# Patient Record
Sex: Female | Born: 1970 | Race: White | Hispanic: No | Marital: Married | Smoking: Never smoker
Health system: Southern US, Community
[De-identification: ages and names within clinical notes are randomized; demographics above are authoritative.]

## PROBLEM LIST (undated history)

## (undated) DIAGNOSIS — K649 Unspecified hemorrhoids: Secondary | ICD-10-CM

## (undated) DIAGNOSIS — M722 Plantar fascial fibromatosis: Secondary | ICD-10-CM

## (undated) DIAGNOSIS — N8003 Adenomyosis of the uterus: Secondary | ICD-10-CM

## (undated) DIAGNOSIS — I10 Essential (primary) hypertension: Secondary | ICD-10-CM

## (undated) DIAGNOSIS — N946 Dysmenorrhea, unspecified: Secondary | ICD-10-CM

## (undated) DIAGNOSIS — N921 Excessive and frequent menstruation with irregular cycle: Secondary | ICD-10-CM

## (undated) DIAGNOSIS — H269 Unspecified cataract: Secondary | ICD-10-CM

## (undated) HISTORY — PX: TONSILLECTOMY: SUR1361

## (undated) HISTORY — DX: Essential (primary) hypertension: I10

## (undated) HISTORY — DX: Unspecified cataract: H26.9

## (undated) HISTORY — PX: WISDOM TOOTH EXTRACTION: SHX21

## (undated) HISTORY — PX: BREAST EXCISIONAL BIOPSY: SUR124

## (undated) HISTORY — PX: BREAST SURGERY: SHX581

## (undated) HISTORY — DX: Plantar fascial fibromatosis: M72.2

---
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
## 1998-12-04 HISTORY — PX: BREAST EXCISIONAL BIOPSY: SUR124

## 2012-01-01 ENCOUNTER — Other Ambulatory Visit (HOSPITAL_COMMUNITY)
Admission: RE | Admit: 2012-01-01 | Discharge: 2012-01-01 | Disposition: A | Discharge status: Home / Self Care | Payer: BC Managed Care – PPO | Source: Ambulatory Visit | Attending: Gynecology | Admitting: Gynecology

## 2012-01-01 ENCOUNTER — Ambulatory Visit (INDEPENDENT_AMBULATORY_CARE_PROVIDER_SITE_OTHER): Payer: BC Managed Care – PPO | Admitting: Gynecology

## 2012-01-01 ENCOUNTER — Encounter: Payer: Self-pay | Admitting: Gynecology

## 2012-01-01 VITALS — BP 122/76 | Ht 64.0 in | Wt 150.0 lb

## 2012-01-01 DIAGNOSIS — Z01419 Encounter for gynecological examination (general) (routine) without abnormal findings: Secondary | ICD-10-CM

## 2012-01-01 DIAGNOSIS — N63 Unspecified lump in unspecified breast: Secondary | ICD-10-CM

## 2012-01-01 DIAGNOSIS — I10 Essential (primary) hypertension: Secondary | ICD-10-CM | POA: Insufficient documentation

## 2012-01-01 DIAGNOSIS — D249 Benign neoplasm of unspecified breast: Secondary | ICD-10-CM | POA: Insufficient documentation

## 2012-01-01 LAB — URINALYSIS W MICROSCOPIC + REFLEX CULTURE
Bilirubin Urine: NEGATIVE
Glucose, UA: NEGATIVE mg/dL
Leukocytes, UA: NEGATIVE
Protein, ur: NEGATIVE mg/dL
Specific Gravity, Urine: >1.03 — ABNORMAL HIGH (ref 1.005–1.030)
pH: 5.5 (ref 5.0–8.0)

## 2012-01-01 NOTE — Progress Notes (Signed)
Magdalen Cabana 1970-12-10 [id number]        41 y.o.  New patient for annual exam.  Overall doing well. She does note some mild urgency type symptoms. It was thought to be due to her hydrochlorothiazide and she was switched to lisinopril but it seems to have persisted. No other urinary symptoms such as frequency or discomfort.  Past medical history,surgical history, medications, allergies, family history and social history were all reviewed and documented in the EPIC chart. ROS:  Was performed and pertinent positives and negatives are included in the history.  Exam: Kim chaperone present Filed Vitals:   01/01/12 1026  BP: 122/76   General appearance  Normal Skin grossly normal Head/Neck normal with no cervical or supraclavicular adenopathy thyroid normal Lungs  clear Cardiac RR, without RMG Abdominal  soft, nontender, without masses, organomegaly or hernia Breasts  examined lying and sitting. Left without masses, retractions, discharge or axillary adenopathy.  Right with 1 cm soft subcutaneous mass 3:00 periphery junction with the sternum. No overlying skin changes nipple discharge or axillary adenopathy Pelvic  Ext/BUS/vagina  normal   Cervix  normal Pap done  Uterus  anteverted, normal size, shape and contour, midline and mobile nontender   Adnexa  Without masses or tenderness    Anus and perineum  normal   Rectovaginal  normal sphincter tone without palpated masses or tenderness.    Assessment/Plan:  41 y.o. female for annual exam.    1. Right breast mass. Not felt by patient initially but now appreciated. 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 Will start with mammogram and ultrasound. Differential reviewed to include breast tissue, lipoma, cyst, fibroadenoma, malignancy. Patient understands the need for follow up.  SBE monthly reviewed. 2. Using vasectomy birth control. 3. Pap smear done as new to the practice. No history of  abnormal Paps before. Discussed possible less frequent screening in the future. 4. Urgency type symptoms.  Will check urinalysis. Discussed possibilities to include interstitial cystitis. She will further discuss with her internist if it persists about whether this is a medication side effect. Ultimate referral to urology discussed. 5. Health maintenance. No blood work was done today as it's all done through her primary who follows her for her hypertension.    Dara Lords MD, 11:10 AM 01/01/2012

## 2012-01-01 NOTE — Patient Instructions (Addendum)
Office will call you to arrange mammography and ultrasound of right breast mass.  Otherwise follow up in one year for your annual gynecologic exam.

## 2012-01-02 ENCOUNTER — Telehealth: Payer: Self-pay | Admitting: *Deleted

## 2012-01-02 DIAGNOSIS — N6310 Unspecified lump in the right breast, unspecified quadrant: Secondary | ICD-10-CM

## 2012-01-02 NOTE — Telephone Encounter (Signed)
Pt called to check on appointment with breast center for right breast mass. Order placed in computer. Pt will be contacted by breast center per jennifer at the breast center.

## 2012-01-02 NOTE — Telephone Encounter (Signed)
appt set up with [facility] on 01/08/12 @3 :20pm patient aware.

## 2012-01-02 NOTE — Telephone Encounter (Signed)
Message copied by Valeda Malm L on Tue Jan 02, 2012  2:20 PM ------      Message from: Colin Broach P      Created: Mon Jan 01, 2012 11:15 AM       Schedule diagnostic mammogram and right breast ultrasound reference right breast mass 3:00 periphery at the junction with the sternum.

## 2012-01-08 ENCOUNTER — Ambulatory Visit
Admission: RE | Admit: 2012-01-08 | Discharge: 2012-01-08 | Disposition: A | Discharge status: Home / Self Care | Payer: BC Managed Care – PPO | Source: Ambulatory Visit | Attending: Gynecology | Admitting: Gynecology

## 2012-01-08 DIAGNOSIS — N6310 Unspecified lump in the right breast, unspecified quadrant: Secondary | ICD-10-CM

## 2012-02-27 ENCOUNTER — Other Ambulatory Visit: Payer: Self-pay | Admitting: Family Medicine

## 2012-02-27 DIAGNOSIS — R1011 Right upper quadrant pain: Secondary | ICD-10-CM

## 2012-02-28 ENCOUNTER — Other Ambulatory Visit: Payer: Self-pay | Admitting: Family Medicine

## 2012-02-28 ENCOUNTER — Ambulatory Visit
Admission: RE | Admit: 2012-02-28 | Discharge: 2012-02-28 | Disposition: A | Discharge status: Home / Self Care | Payer: BC Managed Care – PPO | Source: Ambulatory Visit | Attending: Family Medicine | Admitting: Family Medicine

## 2012-02-28 DIAGNOSIS — R1011 Right upper quadrant pain: Secondary | ICD-10-CM

## 2013-01-02 ENCOUNTER — Ambulatory Visit (INDEPENDENT_AMBULATORY_CARE_PROVIDER_SITE_OTHER): Payer: BC Managed Care – PPO | Admitting: Gynecology

## 2013-01-02 ENCOUNTER — Encounter: Payer: Self-pay | Admitting: Gynecology

## 2013-01-02 VITALS — BP 122/72 | Ht 65.0 in | Wt 150.0 lb

## 2013-01-02 DIAGNOSIS — N949 Unspecified condition associated with female genital organs and menstrual cycle: Secondary | ICD-10-CM

## 2013-01-02 DIAGNOSIS — Z13220 Encounter for screening for lipoid disorders: Secondary | ICD-10-CM

## 2013-01-02 DIAGNOSIS — N644 Mastodynia: Secondary | ICD-10-CM

## 2013-01-02 DIAGNOSIS — Z01419 Encounter for gynecological examination (general) (routine) without abnormal findings: Secondary | ICD-10-CM

## 2013-01-02 DIAGNOSIS — R102 Pelvic and perineal pain: Secondary | ICD-10-CM

## 2013-01-02 LAB — CBC WITH DIFFERENTIAL/PLATELET
Basophils Absolute: 0.1 10*3/uL (ref 0.0–0.1)
Eosinophils Relative: 2 % (ref 0–5)
HCT: 39.4 % (ref 36.0–46.0)
Hemoglobin: 14 g/dL (ref 12.0–15.0)
Lymphocytes Relative: 27 % (ref 12–46)
Lymphs Abs: 1.8 10*3/uL (ref 0.7–4.0)
MCV: 90 fL (ref 78.0–100.0)
Monocytes Absolute: 0.5 10*3/uL (ref 0.1–1.0)
Neutro Abs: 4.3 10*3/uL (ref 1.7–7.7)
RBC: 4.38 MIL/uL (ref 3.87–5.11)
WBC: 6.8 10*3/uL (ref 4.0–10.5)

## 2013-01-02 NOTE — Patient Instructions (Addendum)
Follow up for ultrasound as scheduled 

## 2013-01-02 NOTE — Progress Notes (Signed)
Jodi Short 1971/02/16 [id number]        42 y.o.  [id number] for annual exam.  Several issues noted below.  Past medical history,surgical history, medications, allergies, family history and social history were all reviewed and documented in the EPIC chart. ROS:  Was performed and pertinent positives and negatives are included in the history.  Exam: Kim assistant Filed Vitals:   01/02/13 1358  BP: 122/72  Height: 5\' 5"  (1.651 m)  Weight: 150 lb (68.04 kg)   General appearance  Normal Skin grossly normal Head/Neck normal with no cervical or supraclavicular adenopathy thyroid normal Lungs  clear Cardiac RR, without RMG Abdominal  soft, nontender, without masses, organomegaly or hernia Breasts  examined lying and sitting without masses, retractions, discharge or axillary adenopathy. Pelvic  Ext/BUS/vagina  normal   Cervix  normal   Uterus  anteverted, normal size, shape and contour, midline and mobile nontender   Adnexa  Without masses or tenderness    Anus and perineum  normal   Rectovaginal  normal sphincter tone without palpated masses or tenderness.    Assessment/Plan:  42 y.o. [id number] female for annual exam, vasectomy birth control.   1. Increased dysmenorrhea x2 cycles. Patient notes 3 menstrual cycles ago she had a heavier flow with heavier cramping same thing happened to menses ago. Her last menses was lighter but still cramping. Also notes a lot more breast tenderness bilaterally this last cycle. Exam is normal. Recommend start with ultrasound and we'll go from there she'll follow up for this. 2. Mammography. Patient had diagnostic mammogram ultrasound last year due to breast lump felt in her right breast. Visionthis area has resolved and the ultrasound mammogram were negative then. Her exam today is normal with no residual palpable abnormalities. We'll continue with SBE monthly. Recommend repeat mammogram in one year as she is turning 42 and then start with annual  mammography. 3. Pap smear 2012. No Pap smear done today. No history of abnormal Pap smears. Review current screening guidelines and plan repeat at 3 year interval. 4. Health maintenance. Patient has not had blood work done in some time she is being followed for hypertension. Will check baseline CBC comprehensive metabolic panel lipid profile and urinalysis. Follow up ultrasound.    Dara Lords MD, 2:33 PM 01/02/2013

## 2013-01-03 ENCOUNTER — Other Ambulatory Visit: Payer: Self-pay | Admitting: Gynecology

## 2013-01-03 DIAGNOSIS — E7800 Pure hypercholesterolemia, unspecified: Secondary | ICD-10-CM

## 2013-01-03 LAB — COMPREHENSIVE METABOLIC PANEL
ALT: 24 U/L (ref 0–35)
AST: 24 U/L (ref 0–37)
Albumin: 4.6 g/dL (ref 3.5–5.2)
BUN: 9 mg/dL (ref 6–23)
CO2: 28 mEq/L (ref 19–32)
Calcium: 9.3 mg/dL (ref 8.4–10.5)
Chloride: 102 mEq/L (ref 96–112)
Creat: 0.87 mg/dL (ref 0.50–1.10)
Potassium: 4.4 mEq/L (ref 3.5–5.3)

## 2013-01-03 LAB — URINALYSIS W MICROSCOPIC + REFLEX CULTURE
Bilirubin Urine: NEGATIVE
Casts: NONE SEEN
Glucose, UA: NEGATIVE mg/dL
Squamous Epithelial / LPF: NONE SEEN
pH: 7.5 (ref 5.0–8.0)

## 2013-01-03 LAB — LIPID PANEL
Cholesterol: 210 mg/dL — ABNORMAL HIGH (ref 0–200)
HDL: 57 mg/dL (ref >39)
Total CHOL/HDL Ratio: 3.7 Ratio

## 2013-01-06 ENCOUNTER — Other Ambulatory Visit: Payer: Self-pay | Admitting: Gynecology

## 2013-01-06 MED ORDER — CIPROFLOXACIN HCL 250 MG PO TABS: 250.0000 mg | ORAL_TABLET | Freq: Two times a day (BID) | ORAL | Status: DC

## 2013-01-13 ENCOUNTER — Ambulatory Visit: Payer: BC Managed Care – PPO | Admitting: Gynecology

## 2013-01-13 ENCOUNTER — Other Ambulatory Visit: Payer: BC Managed Care – PPO

## 2013-01-15 ENCOUNTER — Other Ambulatory Visit: Payer: BC Managed Care – PPO

## 2013-01-15 ENCOUNTER — Ambulatory Visit: Payer: BC Managed Care – PPO | Admitting: Gynecology

## 2013-01-18 ENCOUNTER — Other Ambulatory Visit: Payer: Self-pay

## 2013-01-29 ENCOUNTER — Encounter: Payer: Self-pay | Admitting: Gynecology

## 2013-01-29 ENCOUNTER — Ambulatory Visit (INDEPENDENT_AMBULATORY_CARE_PROVIDER_SITE_OTHER): Payer: BC Managed Care – PPO

## 2013-01-29 ENCOUNTER — Ambulatory Visit (INDEPENDENT_AMBULATORY_CARE_PROVIDER_SITE_OTHER): Payer: BC Managed Care – PPO | Admitting: Gynecology

## 2013-01-29 DIAGNOSIS — N926 Irregular menstruation, unspecified: Secondary | ICD-10-CM

## 2013-01-29 DIAGNOSIS — N949 Unspecified condition associated with female genital organs and menstrual cycle: Secondary | ICD-10-CM

## 2013-01-29 DIAGNOSIS — D251 Intramural leiomyoma of uterus: Secondary | ICD-10-CM

## 2013-01-29 DIAGNOSIS — N83209 Unspecified ovarian cyst, unspecified side: Secondary | ICD-10-CM

## 2013-01-29 DIAGNOSIS — N83299 Other ovarian cyst, unspecified side: Secondary | ICD-10-CM

## 2013-01-29 DIAGNOSIS — R102 Pelvic and perineal pain unspecified side: Secondary | ICD-10-CM

## 2013-01-29 DIAGNOSIS — N8300 Follicular cyst of ovary, unspecified side: Secondary | ICD-10-CM

## 2013-01-29 DIAGNOSIS — D259 Leiomyoma of uterus, unspecified: Secondary | ICD-10-CM

## 2013-01-29 DIAGNOSIS — N946 Dysmenorrhea, unspecified: Secondary | ICD-10-CM

## 2013-01-29 NOTE — Patient Instructions (Addendum)
Keep menstrual and pain calendar. Follow up for repeat ultrasound in 3 months. Follow up sooner if significant irregular menses or pain. If you do not start your period this coming week then call and we'll plan on a progesterone withdrawal to bring on a period after checking a pregnancy test.

## 2013-01-29 NOTE — Progress Notes (Signed)
Patient presents for ultrasound.  Had to menses are heavy and very crampy and that her last LMP 12/25/2012 was later but still with some cramps.  Ultrasound shows uterus generous in length with small 18 mm myoma. Endometrial echo 14 mm. Right ovary with thin-walled echo-free cyst 25 mm mean and a smaller paraovarian cyst at 13 and 12. Left ovary shows thin-walled tubular cystic area 19 x 14 avascular. Cul-de-sac negative.  Assessment and plan: Heavy menses x2 with dysmenorrhea. Her last menses was lighter. She's late for her current menses. Vasectomy birth control. Ultrasound shows probable physiologic changes otherwise unremarkable. No evidence of endometriomas. Recommend to keep menstrual calendar over the next several months a repeat ultrasound to follow up on the cystic changes in 3 months. If without menses going into next week we'll check hCG and plan progesterone withdrawal. Ultimately if pain persists then we'll consider laparoscopy.

## 2013-02-06 ENCOUNTER — Telehealth: Payer: Self-pay | Admitting: *Deleted

## 2013-02-06 MED ORDER — MEDROXYPROGESTERONE ACETATE 10 MG PO TABS: 10.0000 mg | ORAL_TABLET | Freq: Every day | ORAL | Status: DC

## 2013-02-06 NOTE — Telephone Encounter (Signed)
Pt is calling to follow up OV on 01/29/13, pt cycle still has not started yet, she was told to follow up. Please advise

## 2013-02-06 NOTE — Telephone Encounter (Signed)
Left message for pt to call.

## 2013-02-06 NOTE — Telephone Encounter (Signed)
Check over-the-counter UPT just to make sure and then Provera 10 mg x10 days should bring on a period. Orders placed

## 2013-02-06 NOTE — Telephone Encounter (Signed)
Pt informed with the below note. 

## 2013-02-12 ENCOUNTER — Telehealth: Payer: Self-pay | Admitting: *Deleted

## 2013-02-12 NOTE — Telephone Encounter (Signed)
Pt was given Rx for provera 10 mg x 10 days on 02/06/13. Took provera on Sunday night and noticed dry cough yesterday morning. She thought since this was a new issue that was noticed after taking provera she would ask if this could be rlated to medication? Please advise

## 2013-02-12 NOTE — Telephone Encounter (Signed)
Pt informed with the below note. 

## 2013-02-12 NOTE — Telephone Encounter (Signed)
I doubt it..  

## 2013-04-03 ENCOUNTER — Ambulatory Visit
Admission: RE | Admit: 2013-04-03 | Discharge: 2013-04-03 | Disposition: A | Discharge status: Home / Self Care | Payer: BC Managed Care – PPO | Source: Ambulatory Visit | Attending: Family Medicine | Admitting: Family Medicine

## 2013-04-03 ENCOUNTER — Other Ambulatory Visit: Payer: Self-pay | Admitting: Family Medicine

## 2013-04-03 DIAGNOSIS — S161XXA Strain of muscle, fascia and tendon at neck level, initial encounter: Secondary | ICD-10-CM

## 2013-04-07 ENCOUNTER — Ambulatory Visit: Payer: BC Managed Care – PPO | Admitting: Physical Therapy

## 2013-04-08 ENCOUNTER — Ambulatory Visit: Payer: BC Managed Care – PPO

## 2013-04-30 ENCOUNTER — Ambulatory Visit (INDEPENDENT_AMBULATORY_CARE_PROVIDER_SITE_OTHER): Payer: BC Managed Care – PPO | Admitting: Gynecology

## 2013-04-30 ENCOUNTER — Encounter: Payer: Self-pay | Admitting: Gynecology

## 2013-04-30 ENCOUNTER — Ambulatory Visit (INDEPENDENT_AMBULATORY_CARE_PROVIDER_SITE_OTHER): Payer: BC Managed Care – PPO

## 2013-04-30 DIAGNOSIS — N949 Unspecified condition associated with female genital organs and menstrual cycle: Secondary | ICD-10-CM

## 2013-04-30 DIAGNOSIS — R102 Pelvic and perineal pain: Secondary | ICD-10-CM

## 2013-04-30 DIAGNOSIS — N7011 Chronic salpingitis: Secondary | ICD-10-CM

## 2013-04-30 DIAGNOSIS — N7013 Chronic salpingitis and oophoritis: Secondary | ICD-10-CM

## 2013-04-30 DIAGNOSIS — N83209 Unspecified ovarian cyst, unspecified side: Secondary | ICD-10-CM

## 2013-04-30 NOTE — Progress Notes (Signed)
Patient follows up for ultrasound.  Ultrasound 01/29/2013 shows uterus generous in length with small 18 mm myoma. Endometrial echo 14 mm. Right ovary with thin-walled echo-free cyst 25 mm mean and a smaller paraovarian cyst at 13 and 12. Left ovary shows thin-walled tubular cystic area 19 x 14 avascular. Cul-de-sac negative. Was recommended for followup ultrasound to relook at the left adnexa and also followup on her symptoms. Patient notes since then she's had several menses that are lighter. The last 2 came at 21 day intervals. Having fleeting left lower quadrant lasting minutes stopping her from walking. No nausea vomiting diarrhea constipation. No urinary symptoms. No dyspareunia.  Ultrasound today shows uterus unchanged from prior exam. Endometrial echo 6.6 mm. Right ovary normal. Left ovary with continued presence of the tubular cystic avascular mass measuring 45 x 11 x 21 mm. Cul-de-sac negative.  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 Reviewed possibilities to include hydrosalpinx, possible endometriosis possible ovarian related. Disclaimer cannot rule out carcinoma discussed. Options of laparoscopic assessment and removal now versus continued observation with re\re ultrasounded for 6 months discussed. The issues of surgical risk versus missed carcinoma reviewed. Patient wants to monitor at present. She'll call if she wants to proceed with surgery before hand but tentatively plans on repeat ultrasound in 4-6 weeks and will call to schedule this.

## 2013-04-30 NOTE — Patient Instructions (Signed)
Followup in 4-6 months for repeat ultrasound. Call sooner if pain worsens or you want to proceed with surgery.

## 2013-09-18 ENCOUNTER — Other Ambulatory Visit: Payer: Self-pay

## 2013-09-18 DIAGNOSIS — Z1231 Encounter for screening mammogram for malignant neoplasm of breast: Secondary | ICD-10-CM

## 2013-10-08 ENCOUNTER — Ambulatory Visit: Payer: BC Managed Care – PPO

## 2013-10-09 ENCOUNTER — Other Ambulatory Visit: Payer: Self-pay

## 2013-10-27 ENCOUNTER — Ambulatory Visit
Admission: RE | Admit: 2013-10-27 | Discharge: 2013-10-27 | Disposition: A | Discharge status: Home / Self Care | Payer: BC Managed Care – PPO | Source: Ambulatory Visit

## 2013-10-27 DIAGNOSIS — Z1231 Encounter for screening mammogram for malignant neoplasm of breast: Secondary | ICD-10-CM

## 2013-10-29 ENCOUNTER — Other Ambulatory Visit: Payer: Self-pay | Admitting: Gynecology

## 2013-10-29 DIAGNOSIS — R928 Other abnormal and inconclusive findings on diagnostic imaging of breast: Secondary | ICD-10-CM

## 2013-11-04 ENCOUNTER — Ambulatory Visit
Admission: RE | Admit: 2013-11-04 | Discharge: 2013-11-04 | Disposition: A | Discharge status: Home / Self Care | Payer: BC Managed Care – PPO | Source: Ambulatory Visit | Attending: Gynecology | Admitting: Gynecology

## 2013-11-04 DIAGNOSIS — R928 Other abnormal and inconclusive findings on diagnostic imaging of breast: Secondary | ICD-10-CM

## 2013-11-14 ENCOUNTER — Telehealth: Payer: Self-pay | Admitting: *Deleted

## 2013-11-14 DIAGNOSIS — N7011 Chronic salpingitis: Secondary | ICD-10-CM

## 2013-11-14 NOTE — Telephone Encounter (Signed)
Pt needs order repeat ultrasound in 4-6 weeks due to small persistent left adnexal tubular cystic avascular mass. Order placed.

## 2013-12-08 ENCOUNTER — Other Ambulatory Visit: Payer: Self-pay | Admitting: Gynecology

## 2013-12-08 ENCOUNTER — Ambulatory Visit (INDEPENDENT_AMBULATORY_CARE_PROVIDER_SITE_OTHER): Payer: BC Managed Care – PPO | Admitting: Gynecology

## 2013-12-08 ENCOUNTER — Encounter: Payer: Self-pay | Admitting: Gynecology

## 2013-12-08 ENCOUNTER — Ambulatory Visit (INDEPENDENT_AMBULATORY_CARE_PROVIDER_SITE_OTHER): Payer: BC Managed Care – PPO

## 2013-12-08 DIAGNOSIS — D259 Leiomyoma of uterus, unspecified: Secondary | ICD-10-CM

## 2013-12-08 DIAGNOSIS — D251 Intramural leiomyoma of uterus: Secondary | ICD-10-CM

## 2013-12-08 DIAGNOSIS — R102 Pelvic and perineal pain: Secondary | ICD-10-CM

## 2013-12-08 DIAGNOSIS — N7013 Chronic salpingitis and oophoritis: Secondary | ICD-10-CM

## 2013-12-08 DIAGNOSIS — N7011 Chronic salpingitis: Secondary | ICD-10-CM

## 2013-12-08 DIAGNOSIS — N949 Unspecified condition associated with female genital organs and menstrual cycle: Secondary | ICD-10-CM

## 2013-12-08 DIAGNOSIS — R1032 Left lower quadrant pain: Secondary | ICD-10-CM

## 2013-12-08 NOTE — Progress Notes (Signed)
Patient presents for followup ultrasound. She has a presumed hydrosalpinx in the left adnexa that we have been following. She has been having fleeting left lower quadrant pain when ambulating lasting seconds. Does not occur frequently.  Ultrasound shows a uterus overall normal with a small myoma 20 mm. Endometrial echo 8.5 mm. Right ovary normal. Left ovary normal with tubular cystic avascular area 31 x 12 x 24 mm. Appears decreased from prior study.  Assessment and plan: Small probable hydrosalpinx left adnexa. Stable if not slightly smaller than prior study. Is having fleeting left lower quadrant pain lasting seconds while ambulating. Does not occur back frequent. She is not having dyspareunia. Menses alternate between normal painful. No intermenstrual bleeding and otherwise regular. Options again reviewed to include laparoscopic removal versus observation. Again disclaimer I cannot guarantee this is not cancer reviewed. Generally what is involved with laparoscopic salpingectomy to include multiple port sites, intraoperative risks to include organ damage such as bowel bladder ureters vessels and nerves necessitating major exploratory reparative surgeries, hemorrhage and transfusion as well as infection discussed. The recovery period afterwards also reviewed. Patient wants to think about what she wants to do at this point is planning observation. She is scheduled for her annual exam in one month and we'll further discuss at that point. If observation, the question as to when to  relook at this area with ultrasound was also discussed.

## 2013-12-08 NOTE — Patient Instructions (Signed)
Followup for annual exam in one month. Thank about whether you want to proceed with surgery or not.

## 2014-01-05 ENCOUNTER — Encounter: Payer: BC Managed Care – PPO | Admitting: Gynecology

## 2014-01-19 ENCOUNTER — Encounter: Payer: BC Managed Care – PPO | Admitting: Gynecology

## 2014-02-12 ENCOUNTER — Ambulatory Visit (INDEPENDENT_AMBULATORY_CARE_PROVIDER_SITE_OTHER): Payer: BC Managed Care – PPO | Admitting: Gynecology

## 2014-02-12 ENCOUNTER — Encounter: Payer: Self-pay | Admitting: Gynecology

## 2014-02-12 VITALS — BP 110/70 | Ht 64.0 in | Wt 147.0 lb

## 2014-02-12 DIAGNOSIS — Z01419 Encounter for gynecological examination (general) (routine) without abnormal findings: Secondary | ICD-10-CM

## 2014-02-12 DIAGNOSIS — N7013 Chronic salpingitis and oophoritis: Secondary | ICD-10-CM

## 2014-02-12 DIAGNOSIS — N7011 Chronic salpingitis: Secondary | ICD-10-CM

## 2014-02-12 NOTE — Progress Notes (Signed)
Jodi Short 1971-10-02 [id number]        43 y.o.  [id number] for annual exam.  Several issues that are below.  Past medical history,surgical history, problem list, medications, allergies, family history and social history were all reviewed and documented in the EPIC chart.  ROS:  Performed and pertinent positives and negatives are included in the history, assessment and plan .  Exam: Kim assistant Filed Vitals:   02/12/14 1531  BP: 110/70  Height: 5\' 4"  (1.626 m)  Weight: 147 lb (66.679 kg)   General appearance  Normal Skin grossly normal Head/Neck normal with no cervical or supraclavicular adenopathy thyroid normal Lungs  clear Cardiac RR, without RMG Abdominal  soft, nontender, without masses, organomegaly or hernia Breasts  examined lying and sitting without masses, retractions, discharge or axillary adenopathy. Pelvic  Ext/BUS/vagina normal  Cervix normal  Uterus anteverted, normal size, shape and contour, midline and mobile nontender   Adnexa  Without masses or tenderness    Anus and perineum  Normal   Rectovaginal  Normal sphincter tone without palpated masses or tenderness.    Assessment/Plan:  43 y.o. [id number] female for annual exam regular menses, vasectomy birth control.   1. Left hydrosalpinx. Patient has small probable left hydrosalpinx that we have been following expectantly. Her last menses was more painful and crampy. Otherwise had been doing well. Her exam is normal. Recommend keeping menstrual calendar. As long as subsequent menses normal then plan repeat ultrasound in several months to followup on the hydrosalpinx. Patient still is deciding whether she wants to have this removed or continue to follow expectantly. The pros/cons, risks/benefits of expectant management missing disease versus surgery with complications discussed. 2. Mammography 10/2013. Continue with annual mammography. SBE monthly reviewed. 3. Pap smear 2013. No Pap smear done today. No history of  abnormal Pap smears previously. Plan repeat Pap smear at 3 year interval next year. 4. Health maintenance. Patient reports routine blood work done through her primary physician's office. Check urinalysis today but no other lab ordered. Followup for ultrasound in June/July sooner if continued pain or she ultimately decides she wants to proceed with surgery.   Note: This document was prepared with digital dictation and possible smart phrase technology. Any transcriptional errors that result from this process are unintentional.   Anastasio Auerbach MD, 4:38 PM 02/12/2014

## 2014-02-12 NOTE — Patient Instructions (Signed)
Follow up for ultrasound in 5-6 months

## 2014-02-13 LAB — URINALYSIS W MICROSCOPIC + REFLEX CULTURE
BACTERIA UA: NONE SEEN
Bilirubin Urine: NEGATIVE
Casts: NONE SEEN
Crystals: NONE SEEN
Glucose, UA: NEGATIVE mg/dL
HGB URINE DIPSTICK: NEGATIVE
Ketones, ur: NEGATIVE mg/dL
LEUKOCYTES UA: NEGATIVE
NITRITE: NEGATIVE
PROTEIN: NEGATIVE mg/dL
Specific Gravity, Urine: 1.019 (ref 1.005–1.030)
Squamous Epithelial / LPF: NONE SEEN
UROBILINOGEN UA: 0.2 mg/dL (ref 0.0–1.0)
pH: 6 (ref 5.0–8.0)

## 2014-03-09 ENCOUNTER — Telehealth: Payer: Self-pay | Admitting: *Deleted

## 2014-03-09 NOTE — Telephone Encounter (Signed)
Pt called c/o hemorrhoids that have caused extreme pain this past weekend. Pt asked name of MD, I told patient [facility] [place] [facility] would be the MD to help with this. Pt has had for 17 years now and would like to have a long term solution. 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.

## 2014-05-13 ENCOUNTER — Encounter: Payer: Self-pay | Admitting: Gynecology

## 2014-05-13 ENCOUNTER — Other Ambulatory Visit: Payer: Self-pay | Admitting: Gynecology

## 2014-05-13 ENCOUNTER — Ambulatory Visit (INDEPENDENT_AMBULATORY_CARE_PROVIDER_SITE_OTHER): Payer: BC Managed Care – PPO | Admitting: Gynecology

## 2014-05-13 ENCOUNTER — Ambulatory Visit (INDEPENDENT_AMBULATORY_CARE_PROVIDER_SITE_OTHER): Payer: BC Managed Care – PPO

## 2014-05-13 DIAGNOSIS — N7011 Chronic salpingitis: Secondary | ICD-10-CM

## 2014-05-13 DIAGNOSIS — N831 Corpus luteum cyst of ovary, unspecified side: Secondary | ICD-10-CM

## 2014-05-13 DIAGNOSIS — R102 Pelvic and perineal pain: Secondary | ICD-10-CM

## 2014-05-13 DIAGNOSIS — D259 Leiomyoma of uterus, unspecified: Secondary | ICD-10-CM

## 2014-05-13 DIAGNOSIS — N912 Amenorrhea, unspecified: Secondary | ICD-10-CM

## 2014-05-13 DIAGNOSIS — N7013 Chronic salpingitis and oophoritis: Secondary | ICD-10-CM

## 2014-05-13 DIAGNOSIS — R350 Frequency of micturition: Secondary | ICD-10-CM

## 2014-05-13 DIAGNOSIS — N949 Unspecified condition associated with female genital organs and menstrual cycle: Secondary | ICD-10-CM

## 2014-05-13 LAB — URINALYSIS W MICROSCOPIC + REFLEX CULTURE
Bilirubin Urine: NEGATIVE
Glucose, UA: NEGATIVE mg/dL
Hgb urine dipstick: NEGATIVE
Ketones, ur: NEGATIVE mg/dL
LEUKOCYTES UA: NEGATIVE
NITRITE: NEGATIVE
PH: 5 (ref 5.0–8.0)
Protein, ur: NEGATIVE mg/dL
SPECIFIC GRAVITY, URINE: 1.025 (ref 1.005–1.030)
UROBILINOGEN UA: 0.2 mg/dL (ref 0.0–1.0)

## 2014-05-13 LAB — PREGNANCY, URINE: PREG TEST UR: NEGATIVE

## 2014-05-13 MED ORDER — MEDROXYPROGESTERONE ACETATE 10 MG PO TABS: 10.0000 mg | ORAL_TABLET | Freq: Two times a day (BID) | ORAL | Status: DC

## 2014-05-13 NOTE — Progress Notes (Signed)
Jodi Short [birth year] [id number]        43 y.o.  [id number] presents for ultrasound in followup of her history probable hydrosalpinx. Also notes the last 2 months urinary frequency. No dysuria or urgency. Primarily at work. Has been evaluated by her primary to include a negative urinalysis and culture. LMP 03/27/2014. Vasectomy birth control. Has had some irregularity which she will be delayed by several days but never by 2-3 weeks.  Past medical history,surgical history, problem list, medications, allergies, family history and social history were all reviewed and documented in the EPIC chart.  Directed ROS with pertinent positives and negatives documented in the history of present illness/assessment and plan.  Exam: Kim assistant General appearance  Normal Spine straight without CVA tenderness. Abdomen soft nontender without masses guarding rebound.  Ultrasound shows uterus overall normal in size with small myoma at 19 mm. Endometrial echo generous at 17 mm. Right and left ovaries with physiologic changes. Continued presence of the left tubulocystic mass, avascular measuring 28 x 11 x 22 mm.  Assessment/Plan:  43 y.o. [name] several issues:  1. Urinary frequency. Urinalysis is negative. Patient does admit to drinking a fair amount coffee at work. Have asked her to stop this and see how this affects her symptoms. If her symptoms continued I discussed possible interstitial cystitis and I would refer her to urology for further evaluation. Patient will call if her symptoms persist. 2. Late for menses. Vasectomy birth control. UPT negative today. No other symptoms such as hair changes skin changes. Ultrasounds negative with the exception of a generous endometrial echo. Recommend Provera withdrawal 10 mg twice a day x5 days. If has a withdrawal bleed will keep menstrual calendar and as long as regular menses ensue and plan expectant management. If no withdrawal or irregular menses continues then plan  further workup. 3. Persistent left tubulocystic mass consistent with a small hydrosalpinx. Ultrasound in January 2015 showed the measurements 31 x 12 x 24 mm. Appears to be smaller now. Certainly everything points towards a benign etiology. Recommend continued expectant management with followup at her next annual exam and she agrees with this. She is comfortable with following expectantly.   Note: This document was prepared with digital dictation and possible smart phrase technology. Any transcriptional errors that result from this process are unintentional.   Anastasio Auerbach MD, 4:43 PM 05/13/2014

## 2014-05-13 NOTE — Patient Instructions (Signed)
Followup routinely for annual exam. Call if your urinary symptoms continue and I will refer you to a urologist.

## 2014-07-16 ENCOUNTER — Encounter (HOSPITAL_COMMUNITY): Payer: Self-pay | Admitting: Emergency Medicine

## 2014-07-16 ENCOUNTER — Emergency Department (HOSPITAL_COMMUNITY)
Admission: EM | Admit: 2014-07-16 | Discharge: 2014-07-16 | Disposition: A | Discharge status: Home / Self Care | Payer: BC Managed Care – PPO | Attending: Emergency Medicine | Admitting: Emergency Medicine

## 2014-07-16 DIAGNOSIS — R002 Palpitations: Secondary | ICD-10-CM | POA: Diagnosis not present

## 2014-07-16 DIAGNOSIS — R000 Tachycardia, unspecified: Secondary | ICD-10-CM | POA: Insufficient documentation

## 2014-07-16 DIAGNOSIS — Z3202 Encounter for pregnancy test, result negative: Secondary | ICD-10-CM | POA: Diagnosis not present

## 2014-07-16 DIAGNOSIS — I10 Essential (primary) hypertension: Secondary | ICD-10-CM | POA: Insufficient documentation

## 2014-07-16 DIAGNOSIS — Z79899 Other long term (current) drug therapy: Secondary | ICD-10-CM | POA: Diagnosis not present

## 2014-07-16 LAB — I-STAT TROPONIN, ED: Troponin i, poc: 0 ng/mL (ref 0.00–0.08)

## 2014-07-16 LAB — POC URINE PREG, ED: Preg Test, Ur: NEGATIVE

## 2014-07-16 LAB — I-STAT CHEM 8, ED
BUN: 9 mg/dL (ref 6–23)
CALCIUM ION: 1.19 mmol/L (ref 1.12–1.23)
CREATININE: 0.9 mg/dL (ref 0.50–1.10)
Chloride: 106 mEq/L (ref 96–112)
GLUCOSE: 172 mg/dL — AB (ref 70–99)
HCT: 42 % (ref 36.0–46.0)
Hemoglobin: 14.3 g/dL (ref 12.0–15.0)
Potassium: 4.3 mEq/L (ref 3.7–5.3)
SODIUM: 142 meq/L (ref 137–147)
TCO2: 25 mmol/L (ref 0–100)

## 2014-07-16 LAB — D-DIMER, QUANTITATIVE: D-Dimer, Quant: 0.36 ug/mL-FEU (ref 0.00–0.48)

## 2014-07-16 NOTE — ED Provider Notes (Signed)
CSN: [id number]     Arrival date & time 07/16/14  1628 History   First MD Initiated Contact with Patient 07/16/14 1711     Chief Complaint  Patient presents with  . Tachycardia      Patient is a 43 y.o. female presenting with palpitations. The history is provided by the patient.  Palpitations Palpitations quality:  Unable to specify Onset quality:  Sudden Timing:  Intermittent Progression:  Waxing and waning Chronicity:  New Relieved by:  None tried Worsened by:  Nothing tried Associated symptoms: no back pain, no chest pain, no lower extremity edema, no shortness of breath and no syncope   Risk factors: no hx of atrial fibrillation and no hx of PE   Pt reports she had sudden onset of "heart racing" several hrs ago while at work.  It improved spontaneously but has since returned.  No syncope but reports feeling lightheaded No active CP but reports "heart pounding"  She has no h/o CAD/PE/DVT  She does not smoke.  No drug use reported No excessive caffeine use  Past Medical History  Diagnosis Date  . Hypertension    Past Surgical History  Procedure Laterality Date  . Tonsillectomy    . Breast surgery      Breast adenoma   Family History  Problem Relation Age of Onset  . Diabetes Father   . Diabetes Maternal Grandmother   . Heart attack Maternal Grandmother   . Diabetes Paternal Grandfather   . Diabetes Son   . Melanoma Sister    History  Substance Use Topics  . Smoking status: Never Smoker   . Smokeless tobacco: Not on file  . Alcohol Use: 1.5 oz/week    3 drink(s) per week     Comment: occasionally    OB History   Grav Para Term Preterm Abortions TAB SAB Ect Mult Living   3 2 2  1  1   2      Review of Systems  Respiratory: Negative for shortness of breath.   Cardiovascular: Positive for palpitations. Negative for chest pain and syncope.  Musculoskeletal: Negative for back pain.  Neurological: Negative for syncope.  All other systems reviewed and are  negative.     Allergies  Sulfa antibiotics  Home Medications   Prior to Admission medications   Medication Sig Start Date End Date Taking? Authorizing Provider  lisinopril (PRINIVIL,ZESTRIL) 5 MG tablet Take 5 mg by mouth at bedtime.    Yes Historical Provider, MD  Multiple Vitamin (MULTIVITAMIN) tablet Take 1 tablet by mouth daily.   Yes Historical Provider, MD  Omega-3 Fatty Acids (FISH OIL) 1000 MG CAPS Take 2,000 capsules by mouth daily.   Yes Historical Provider, MD   BP 137/71  Pulse 85  Temp(Src) 98.3 F (36.8 C) (Oral)  Resp 22  SpO2 100%  LMP 07/08/2014 Physical Exam CONSTITUTIONAL: Well developed/well nourished HEAD: Normocephalic/atraumatic EYES: EOMI/PERRL ENMT: Mucous membranes moist NECK: supple no meningeal signs SPINE:entire spine nontender CV: S1/S2 noted, no murmurs/rubs/gallops noted LUNGS: Lungs are clear to auscultation bilaterally, no apparent distress ABDOMEN: soft, nontender, no rebound or guarding GU:no cva tenderness NEURO: Pt is awake/alert, moves all extremitiesx4 EXTREMITIES: pulses normal, full ROM, no LE tenderness/edema SKIN: warm, color normal PSYCH: no abnormalities of mood noted  ED Course  Procedures  6:23 PM Pt well appearing She reported an episode while I was in room.  HR was at 109, sinus rhythm EKG does not reveal any dysrhythmia 7:23 PM Labs reassuring She had no  further symptoms She has f/u tomorrow with her PCP We discussed strict return precautions Pt is stable for d/c home I doubt ACS at this time Labs Review Labs Reviewed  I-STAT CHEM 8, ED - Abnormal; Notable for the following:    Glucose, Bld 172 (*)    All other components within normal limits  D-DIMER, QUANTITATIVE  I-STAT TROPOININ, ED  POC URINE PREG, ED      EKG Interpretation   Date/Time:  Thursday July 16 2014 16:36:36 EDT Ventricular Rate:  91 PR Interval:  147 QRS Duration: 85 QT Interval:  347 QTC Calculation: 427 R Axis:   73 Text  Interpretation:  Sinus rhythm Baseline wander Otherwise within normal  limits No old tracing to compare Confirmed by [place]  MD-I, IVA ([id number]) on  07/16/2014 4:42:37 PM      MDM   Final diagnoses:  Palpitations    Nursing notes including past medical history and social history reviewed and considered in documentation Labs/vital reviewed and considered     Sharyon Cable, MD 07/16/14 [phone]

## 2014-07-16 NOTE — ED Notes (Signed)
Pt presents with c/o heart racing and feeling lightheaded. Pt reports that she has had 4 episodes of this today where she felt lightheaded and her heart felt like it was "pounding". Pt reports no hx of anxiety or any cardiac related illness. Pt reports that she does take medication for her blood pressure. HR stable at this time, 85 BPM.

## 2014-07-16 NOTE — Progress Notes (Signed)
  CARE MANAGEMENT ED NOTE 07/16/2014  Patient:  Jodi Short,Jodi Short   Account Number:  [id number]  Date Initiated:  07/16/2014  Documentation initiated by:  Jackelyn Poling  Subjective/Objective Assessment:   43 yr old bcbs ppo [place] state [place] pt c/o racing heart rate     Subjective/Objective Assessment Detail:   pcp [place] at [place] at [place]     Action/Plan:   ED CM noted not pcp listed spoke with pt EPIC updated   Action/Plan Detail:   Anticipated DC Date:       Status Recommendation to Physician:   Result of Recommendation:    Other ED [place]  Other  PCP issues  Outpatient Services - Pt will follow up    Choice offered to / List presented to:            Status of service:  Completed, signed off  ED Comments:   ED Comments Detail:

## 2014-09-30 ENCOUNTER — Other Ambulatory Visit: Payer: Self-pay

## 2014-09-30 DIAGNOSIS — Z1231 Encounter for screening mammogram for malignant neoplasm of breast: Secondary | ICD-10-CM

## 2014-10-05 ENCOUNTER — Encounter (HOSPITAL_COMMUNITY): Payer: Self-pay | Admitting: Emergency Medicine

## 2014-10-20 ENCOUNTER — Other Ambulatory Visit: Payer: Self-pay | Admitting: Dermatology

## 2014-11-12 ENCOUNTER — Ambulatory Visit
Admission: RE | Admit: 2014-11-12 | Discharge: 2014-11-12 | Disposition: A | Discharge status: Home / Self Care | Payer: BC Managed Care – PPO | Source: Ambulatory Visit

## 2014-11-12 DIAGNOSIS — Z1231 Encounter for screening mammogram for malignant neoplasm of breast: Secondary | ICD-10-CM

## 2015-03-22 ENCOUNTER — Encounter: Payer: Self-pay | Admitting: Gynecology

## 2015-05-17 ENCOUNTER — Other Ambulatory Visit (HOSPITAL_COMMUNITY)
Admission: RE | Admit: 2015-05-17 | Discharge: 2015-05-17 | Disposition: A | Discharge status: Home / Self Care | Payer: BLUE CROSS/BLUE SHIELD | Source: Ambulatory Visit | Attending: Gynecology | Admitting: Gynecology

## 2015-05-17 ENCOUNTER — Ambulatory Visit (INDEPENDENT_AMBULATORY_CARE_PROVIDER_SITE_OTHER): Payer: BLUE CROSS/BLUE SHIELD | Admitting: Gynecology

## 2015-05-17 ENCOUNTER — Encounter: Payer: Self-pay | Admitting: Gynecology

## 2015-05-17 VITALS — BP 110/70 | Ht 64.0 in | Wt 136.0 lb

## 2015-05-17 DIAGNOSIS — Z01411 Encounter for gynecological examination (general) (routine) with abnormal findings: Secondary | ICD-10-CM | POA: Insufficient documentation

## 2015-05-17 DIAGNOSIS — Z01419 Encounter for gynecological examination (general) (routine) without abnormal findings: Secondary | ICD-10-CM | POA: Diagnosis not present

## 2015-05-17 DIAGNOSIS — N7011 Chronic salpingitis: Secondary | ICD-10-CM | POA: Diagnosis not present

## 2015-05-17 DIAGNOSIS — N898 Other specified noninflammatory disorders of vagina: Secondary | ICD-10-CM | POA: Diagnosis not present

## 2015-05-17 DIAGNOSIS — Z1151 Encounter for screening for human papillomavirus (HPV): Secondary | ICD-10-CM | POA: Insufficient documentation

## 2015-05-17 DIAGNOSIS — R1032 Left lower quadrant pain: Secondary | ICD-10-CM | POA: Diagnosis not present

## 2015-05-17 LAB — WET PREP FOR TRICH, YEAST, CLUE
Clue Cells Wet Prep HPF POC: NONE SEEN
Trich, Wet Prep: NONE SEEN

## 2015-05-17 MED ORDER — FLUCONAZOLE 150 MG PO TABS: 150.0000 mg | ORAL_TABLET | Freq: Once | ORAL | Status: DC

## 2015-05-17 NOTE — Patient Instructions (Signed)
You may obtain a copy of any labs that were done today by logging onto MyChart as outlined in the instructions provided with your AVS (after visit summary). The office will not call with normal lab results but certainly if there are any significant abnormalities then we will contact you.   Health Maintenance, Female A healthy lifestyle and preventative care can promote health and wellness.  Maintain regular health, dental, and eye exams.  Eat a healthy diet. Foods like vegetables, fruits, whole grains, low-fat dairy products, and lean protein foods contain the nutrients you need without too many calories. Decrease your intake of foods high in solid fats, added sugars, and salt. Get information about a proper diet from your caregiver, if necessary.  Regular physical exercise is one of the most important things you can do for your health. Most adults should get at least 150 minutes of moderate-intensity exercise (any activity that increases your heart rate and causes you to sweat) each week. In addition, most adults need muscle-strengthening exercises on 2 or more days a week.   Maintain a healthy weight. The body mass index (BMI) is a screening tool to identify possible weight problems. It provides an estimate of body fat based on height and weight. Your caregiver can help determine your BMI, and can help you achieve or maintain a healthy weight. For adults 20 years and older:  A BMI below 18.5 is considered underweight.  A BMI of 18.5 to 24.9 is normal.  A BMI of 25 to 29.9 is considered overweight.  A BMI of 30 and above is considered obese.  Maintain normal blood lipids and cholesterol by exercising and minimizing your intake of saturated fat. Eat a balanced diet with plenty of fruits and vegetables. Blood tests for lipids and cholesterol should begin at age 61 and be repeated every 5 years. If your lipid or cholesterol levels are high, you are over 50, or you are a high risk for heart  disease, you may need your cholesterol levels checked more frequently.Ongoing high lipid and cholesterol levels should be treated with medicines if diet and exercise are not effective.  If you smoke, find out from your caregiver how to quit. If you do not use tobacco, do not start.  Lung cancer screening is recommended for adults aged 33 80 years who are at high risk for developing lung cancer because of a history of smoking. Yearly low-dose computed tomography (CT) is recommended for people who have at least a 30-pack-year history of smoking and are a current smoker or have quit within the past 15 years. A pack year of smoking is smoking an average of 1 pack of cigarettes a day for 1 year (for example: 1 pack a day for 30 years or 2 packs a day for 15 years). Yearly screening should continue until the smoker has stopped smoking for at least 15 years. Yearly screening should also be stopped for people who develop a health problem that would prevent them from having lung cancer treatment.  If you are pregnant, do not drink alcohol. If you are breastfeeding, be very cautious about drinking alcohol. If you are not pregnant and choose to drink alcohol, do not exceed 1 drink per day. One drink is considered to be 12 ounces (355 mL) of beer, 5 ounces (148 mL) of wine, or 1.5 ounces (44 mL) of liquor.  Avoid use of street drugs. Do not share needles with anyone. Ask for help if you need support or instructions about stopping  the use of drugs.  High blood pressure causes heart disease and increases the risk of stroke. Blood pressure should be checked at least every 1 to 2 years. Ongoing high blood pressure should be treated with medicines, if weight loss and exercise are not effective.  If you are 59 to 42 years old, ask your caregiver if you should take aspirin to prevent strokes.  Diabetes screening involves taking a blood sample to check your fasting blood sugar level. This should be done once every 3  years, after age 91, if you are within normal weight and without risk factors for diabetes. Testing should be considered at a younger age or be carried out more frequently if you are overweight and have at least 1 risk factor for diabetes.  Breast cancer screening is essential preventative care for women. You should practice "breast self-awareness." This means understanding the normal appearance and feel of your breasts and may include breast self-examination. Any changes detected, no matter how small, should be reported to a caregiver. Women in their 66s and 30s should have a clinical breast exam (CBE) by a caregiver as part of a regular health exam every 1 to 3 years. After age 101, women should have a CBE every year. Starting at age 100, women should consider having a mammogram (breast X-ray) every year. Women who have a family history of breast cancer should talk to their caregiver about genetic screening. Women at a high risk of breast cancer should talk to their caregiver about having an MRI and a mammogram every year.  Breast cancer gene (BRCA)-related cancer risk assessment is recommended for women who have family members with BRCA-related cancers. BRCA-related cancers include breast, ovarian, tubal, and peritoneal cancers. Having family members with these cancers may be associated with an increased risk for harmful changes (mutations) in the breast cancer genes BRCA1 and BRCA2. Results of the assessment will determine the need for genetic counseling and BRCA1 and BRCA2 testing.  The Pap test is a screening test for cervical cancer. Women should have a Pap test starting at age 57. Between ages 25 and 35, Pap tests should be repeated every 2 years. Beginning at age 37, you should have a Pap test every 3 years as long as the past 3 Pap tests have been normal. If you had a hysterectomy for a problem that was not cancer or a condition that could lead to cancer, then you no longer need Pap tests. If you are  between ages 50 and 76, and you have had normal Pap tests going back 10 years, you no longer need Pap tests. If you have had past treatment for cervical cancer or a condition that could lead to cancer, you need Pap tests and screening for cancer for at least 20 years after your treatment. If Pap tests have been discontinued, risk factors (such as a new sexual partner) need to be reassessed to determine if screening should be resumed. Some women have medical problems that increase the chance of getting cervical cancer. In these cases, your caregiver may recommend more frequent screening and Pap tests.  The human papillomavirus (HPV) test is an additional test that may be used for cervical cancer screening. The HPV test looks for the virus that can cause the cell changes on the cervix. The cells collected during the Pap test can be tested for HPV. The HPV test could be used to screen women aged 44 years and older, and should be used in women of any age  who have unclear Pap test results. After the age of 55, women should have HPV testing at the same frequency as a Pap test.  Colorectal cancer can be detected and often prevented. Most routine colorectal cancer screening begins at the age of 44 and continues through age 20. However, your caregiver may recommend screening at an earlier age if you have risk factors for colon cancer. On a yearly basis, your caregiver may provide home test kits to check for hidden blood in the stool. Use of a small camera at the end of a tube, to directly examine the colon (sigmoidoscopy or colonoscopy), can detect the earliest forms of colorectal cancer. Talk to your caregiver about this at age 86, when routine screening begins. Direct examination of the colon should be repeated every 5 to 10 years through age 13, unless early forms of pre-cancerous polyps or small growths are found.  Hepatitis C blood testing is recommended for all people born from [birth year] through 1963 and any  individual with known risks for hepatitis C.  Practice safe sex. Use condoms and avoid high-risk sexual practices to reduce the spread of sexually transmitted infections (STIs). Sexually active women aged 36 and younger should be checked for Chlamydia, which is a common sexually transmitted infection. Older women with new or multiple partners should also be tested for Chlamydia. Testing for other STIs is recommended if you are sexually active and at increased risk.  Osteoporosis is a disease in which the bones lose minerals and strength with aging. This can result in serious bone fractures. The risk of osteoporosis can be identified using a bone density scan. Women ages 20 and over and women at risk for fractures or osteoporosis should discuss screening with their caregivers. Ask your caregiver whether you should be taking a calcium supplement or vitamin D to reduce the rate of osteoporosis.  Menopause can be associated with physical symptoms and risks. Hormone replacement therapy is available to decrease symptoms and risks. You should talk to your caregiver about whether hormone replacement therapy is right for you.  Use sunscreen. Apply sunscreen liberally and repeatedly throughout the day. You should seek shade when your shadow is shorter than you. Protect yourself by wearing long sleeves, pants, a wide-brimmed hat, and sunglasses year round, whenever you are outdoors.  Notify your caregiver of new moles or changes in moles, especially if there is a change in shape or color. Also notify your caregiver if a mole is larger than the size of a pencil eraser.  Stay current with your immunizations. Document Released: 06/05/2011 Document Revised: 03/17/2013 Document Reviewed: 06/05/2011 [facility] Patient Information 2014 [employer].

## 2015-05-17 NOTE — Progress Notes (Signed)
Jodi Short 04-18-1971 [id number]        44 y.o.  [id number] for annual exam.  Several issues noted below.  Past medical history,surgical history, problem list, medications, allergies, family history and social history were all reviewed and documented as reviewed in the EPIC chart.  ROS:  Performed with pertinent positives and negatives included in the history, assessment and plan.   Additional significant findings :  none   Exam: Kim Counsellor Vitals:   05/17/15 1430  BP: 110/70  Height: 5\' 4"  (1.626 m)  Weight: 136 lb (61.689 kg)   General appearance:  Normal affect, orientation and appearance. Skin: Grossly normal HEENT: Without gross lesions.  No cervical or supraclavicular adenopathy. Thyroid normal.  Lungs:  Clear without wheezing, rales or rhonchi Cardiac: RR, without RMG Abdominal:  Soft, nontender, without masses, guarding, rebound, organomegaly or hernia Breasts:  Examined lying and sitting without masses, retractions, discharge or axillary adenopathy. Pelvic:  Ext/BUS/vagina with white discharge.  Cervix grossly normal. Pap smear/HPV  Uterus anteverted, normal size, shape and contour, midline and mobile nontender   Adnexa  Without masses or tenderness    Anus and perineum  Normal   Rectovaginal  Normal sphincter tone without palpated masses or tenderness.    Assessment/Plan:  44 y.o. [id number] female for annual exam with regular menses, vasectomy birth control.   1. Left lower quadrant pain. Patient notes last month or so some nagging left-sided pain from flying down at the pelvis. Saw primary physician who did a urinalysis which was negative. She does have a history of a small hydrosalpinx on the left that we have been following. Last measurement June 2015 was 28 x 11 x 22 mm. Will start with repeat ultrasound now and urinalysis. Differential to include GYN versus non-GYN discussed. Will further discuss after ultrasound. 2. Decreased libido/vaginal irritation after  intercourse. Patient notes a short course of Lexapro for anxiety previously by primary physician earlier this year. Did not like the way she felt and she stopped it. Her anxiety resolved but notes afterwards she has decreased libido. Also notes some irritation after intercourse. No hot flushes night sweats or irregular menses. Wet prep done today does show yeast. Will treat with Diflucan 150 mg 1 dose. I reviewed the various inputs to libido and options up to including formulated testosterone cream. Patient is not interested in intervention right now but prefers just to monitor. 3. Pap smear 2013. Pap/HPV today. No history of significant abnormal Pap smears previously. 4. Mammography 11/2014. Continue with annual mammography when due. SBE monthly reviewed. 5. Health maintenance. No routine blood work done as this is done at her primary physician's office. Follow up for ultrasound, otherwise 1 year for annual exam.     Anastasio Auerbach MD, 2:58 PM 05/17/2015

## 2015-05-17 NOTE — Addendum Note (Signed)
Addended by: Nelva Nay on: 05/17/2015 03:52 PM   Modules accepted: Orders

## 2015-05-17 NOTE — Addendum Note (Signed)
Addended by: Nelva Nay on: 05/17/2015 03:24 PM   Modules accepted: Orders

## 2015-05-18 LAB — URINALYSIS W MICROSCOPIC + REFLEX CULTURE
BILIRUBIN URINE: NEGATIVE
Bacteria, UA: NONE SEEN
CRYSTALS: NONE SEEN
Casts: NONE SEEN
GLUCOSE, UA: NEGATIVE mg/dL
Hgb urine dipstick: NEGATIVE
Ketones, ur: NEGATIVE mg/dL
Leukocytes, UA: NEGATIVE
Nitrite: NEGATIVE
PH: 5 (ref 5.0–8.0)
Protein, ur: NEGATIVE mg/dL
SPECIFIC GRAVITY, URINE: 1.011 (ref 1.005–1.030)
SQUAMOUS EPITHELIAL / LPF: NONE SEEN
UROBILINOGEN UA: 0.2 mg/dL (ref 0.0–1.0)

## 2015-05-19 LAB — CYTOLOGY - PAP

## 2015-06-14 ENCOUNTER — Other Ambulatory Visit: Payer: BLUE CROSS/BLUE SHIELD

## 2015-06-14 ENCOUNTER — Ambulatory Visit: Payer: BLUE CROSS/BLUE SHIELD | Admitting: Gynecology

## 2015-06-28 ENCOUNTER — Ambulatory Visit (INDEPENDENT_AMBULATORY_CARE_PROVIDER_SITE_OTHER): Payer: BLUE CROSS/BLUE SHIELD

## 2015-06-28 ENCOUNTER — Ambulatory Visit (INDEPENDENT_AMBULATORY_CARE_PROVIDER_SITE_OTHER): Payer: BLUE CROSS/BLUE SHIELD | Admitting: Gynecology

## 2015-06-28 ENCOUNTER — Encounter: Payer: Self-pay | Admitting: Gynecology

## 2015-06-28 DIAGNOSIS — N7011 Chronic salpingitis: Secondary | ICD-10-CM | POA: Diagnosis not present

## 2015-06-28 DIAGNOSIS — R1032 Left lower quadrant pain: Secondary | ICD-10-CM | POA: Diagnosis not present

## 2015-06-28 DIAGNOSIS — R102 Pelvic and perineal pain: Secondary | ICD-10-CM | POA: Diagnosis not present

## 2015-06-28 NOTE — Patient Instructions (Signed)
Follow up if pain recurs, otherwise in 1 year for annual exam

## 2015-06-28 NOTE — Progress Notes (Signed)
Jodi Short 1970-12-09 [id number]        44 y.o.  [id number] presents for ultrasound due to history of left pelvic pain and left hydrosalpinx.  Was measured at 28 x 11 x 22 mm 2015.  Patient notes that her left-sided pain has resolved. Is having some menstrual-like cramping that occurs sporadically throughout the month lasting several minutes at a time. Unrelated to her menses. Has occurred of the last 1-2 cycles. No irregular bleeding.  Past medical history,surgical history, problem list, medications, allergies, family history and social history were all reviewed and documented in the EPIC chart.  Directed ROS with pertinent positives and negatives documented in the history of present illness/assessment and plan.  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 Endometrial echo 7.8 mm. Right ovary normal. Left ovary normal. Tubulo-cystic mass left adnexa 27 x 13 x 24 mm, avascular. Cul-de-sac negative.  Assessment/Plan:  44 y.o. [id number] with persistent stable tubular cystic mass left adnexa consistent with small hydrosalpinx. Paratubal cyst possibility also discussed.  Has remained stable over the past year on serial ultrasounds. Options to include continued observation versus surgical removal reviewed. As patient's left-sided pain has resolved she is comfortable with observation. We discussed her menstrual like cramping that's begun over the last 1-2 months. She will also monitor this and if it continues or certainly worsens may consider laparoscopy rule out endometriosis. If resolves them will follow. Assuming she does well then she will follow up in one year for annual exam, sooner as needed.    Anastasio Auerbach MD, 9:35 AM 06/28/2015

## 2015-10-25 ENCOUNTER — Other Ambulatory Visit: Payer: Self-pay

## 2015-10-25 DIAGNOSIS — Z1231 Encounter for screening mammogram for malignant neoplasm of breast: Secondary | ICD-10-CM

## 2015-11-15 ENCOUNTER — Ambulatory Visit
Admission: RE | Admit: 2015-11-15 | Discharge: 2015-11-15 | Disposition: A | Discharge status: Home / Self Care | Payer: BLUE CROSS/BLUE SHIELD | Source: Ambulatory Visit

## 2015-11-15 DIAGNOSIS — Z1231 Encounter for screening mammogram for malignant neoplasm of breast: Secondary | ICD-10-CM

## 2016-06-27 ENCOUNTER — Ambulatory Visit (INDEPENDENT_AMBULATORY_CARE_PROVIDER_SITE_OTHER): Payer: BLUE CROSS/BLUE SHIELD | Admitting: Gynecology

## 2016-06-27 ENCOUNTER — Encounter: Payer: Self-pay | Admitting: Gynecology

## 2016-06-27 VITALS — BP 114/64 | Ht 65.0 in | Wt 142.0 lb

## 2016-06-27 DIAGNOSIS — R110 Nausea: Secondary | ICD-10-CM

## 2016-06-27 DIAGNOSIS — Z13220 Encounter for screening for lipoid disorders: Secondary | ICD-10-CM

## 2016-06-27 DIAGNOSIS — Z01419 Encounter for gynecological examination (general) (routine) without abnormal findings: Secondary | ICD-10-CM | POA: Diagnosis not present

## 2016-06-27 DIAGNOSIS — R1031 Right lower quadrant pain: Secondary | ICD-10-CM | POA: Diagnosis not present

## 2016-06-27 DIAGNOSIS — N7011 Chronic salpingitis: Secondary | ICD-10-CM

## 2016-06-27 LAB — COMPREHENSIVE METABOLIC PANEL
ALBUMIN: 4.2 g/dL (ref 3.6–5.1)
ALT: 9 U/L (ref 6–29)
AST: 13 U/L (ref 10–30)
Alkaline Phosphatase: 42 U/L (ref 33–115)
BILIRUBIN TOTAL: 0.4 mg/dL (ref 0.2–1.2)
BUN: 16 mg/dL (ref 7–25)
CHLORIDE: 104 mmol/L (ref 98–110)
CO2: 23 mmol/L (ref 20–31)
Calcium: 9.3 mg/dL (ref 8.6–10.2)
Creat: 0.89 mg/dL (ref 0.50–1.10)
GLUCOSE: 96 mg/dL (ref 65–99)
Potassium: 4.4 mmol/L (ref 3.5–5.3)
Sodium: 136 mmol/L (ref 135–146)
Total Protein: 7 g/dL (ref 6.1–8.1)

## 2016-06-27 LAB — CBC WITH DIFFERENTIAL/PLATELET
BASOS ABS: 116 {cells}/uL (ref 0–200)
Basophils Relative: 2 %
Eosinophils Absolute: 116 cells/uL (ref 15–500)
Eosinophils Relative: 2 %
HCT: 39.8 % (ref 35.0–45.0)
HEMOGLOBIN: 13.3 g/dL (ref 11.7–15.5)
LYMPHS ABS: 2030 {cells}/uL (ref 850–3900)
LYMPHS PCT: 35 %
MCH: 31.4 pg (ref 27.0–33.0)
MCHC: 33.4 g/dL (ref 32.0–36.0)
MCV: 94.1 fL (ref 80.0–100.0)
MPV: 11.4 fL (ref 7.5–12.5)
Monocytes Absolute: 406 cells/uL (ref 200–950)
Monocytes Relative: 7 %
NEUTROS PCT: 54 %
Neutro Abs: 3132 cells/uL (ref 1500–7800)
Platelets: 180 10*3/uL (ref 140–400)
RBC: 4.23 MIL/uL (ref 3.80–5.10)
RDW: 13.5 % (ref 11.0–15.0)
WBC: 5.8 10*3/uL (ref 3.8–10.8)

## 2016-06-27 LAB — LIPID PANEL
Cholesterol: 182 mg/dL (ref 125–200)
HDL: 75 mg/dL (ref >=46)
LDL CALC: 94 mg/dL (ref <130)
TRIGLYCERIDES: 65 mg/dL (ref <150)
Total CHOL/HDL Ratio: 2.4 Ratio (ref <=5.0)
VLDL: 13 mg/dL (ref <30)

## 2016-06-27 NOTE — Patient Instructions (Signed)
Follow up the ultrasound as scheduled.  You may obtain a copy of any labs that were done today by logging onto MyChart as outlined in the instructions provided with your AVS (after visit summary). The office will not call with normal lab results but certainly if there are any significant abnormalities then we will contact you.   Health Maintenance Adopting a healthy lifestyle and getting preventive care can go a long way to promote health and wellness. Talk with your health care provider about what schedule of regular examinations is right for you. This is a good chance for you to check in with your provider about disease prevention and staying healthy. In between checkups, there are plenty of things you can do on your own. Experts have done a lot of research about which lifestyle changes and preventive measures are most likely to keep you healthy. Ask your health care provider for more information. WEIGHT AND DIET  Eat a healthy diet  Be sure to include plenty of vegetables, fruits, low-fat dairy products, and lean protein.  Do not eat a lot of foods high in solid fats, added sugars, or salt.  Get regular exercise. This is one of the most important things you can do for your health.  Most adults should exercise for at least 150 minutes each week. The exercise should increase your heart rate and make you sweat (moderate-intensity exercise).  Most adults should also do strengthening exercises at least twice a week. This is in addition to the moderate-intensity exercise.  Maintain a healthy weight  Body mass index (BMI) is a measurement that can be used to identify possible weight problems. It estimates body fat based on height and weight. Your health care provider can help determine your BMI and help you achieve or maintain a healthy weight.  For females 104 years of age and older:   A BMI below 18.5 is considered underweight.  A BMI of 18.5 to 24.9 is normal.  A BMI of 25 to 29.9 is  considered overweight.  A BMI of 30 and above is considered obese.  Watch levels of cholesterol and blood lipids  You should start having your blood tested for lipids and cholesterol at 43 years of age, then have this test every 5 years.  You may need to have your cholesterol levels checked more often if:  Your lipid or cholesterol levels are high.  You are older than 43 years of age.  You are at high risk for heart disease.  CANCER SCREENING   Lung Cancer  Lung cancer screening is recommended for adults 41-62 years old who are at high risk for lung cancer because of a history of smoking.  A yearly low-dose CT scan of the lungs is recommended for people who:  Currently smoke.  Have quit within the past 15 years.  Have at least a 30-pack-year history of smoking. A pack year is smoking an average of one pack of cigarettes a day for 1 year.  Yearly screening should continue until it has been 15 years since you quit.  Yearly screening should stop if you develop a health problem that would prevent you from having lung cancer treatment.  Breast Cancer  Practice breast self-awareness. This means understanding how your breasts normally appear and feel.  It also means doing regular breast self-exams. Let your health care provider know about any changes, no matter how small.  If you are in your 20s or 30s, you should have a clinical breast exam (CBE)  by a health care provider every 1-3 years as part of a regular health exam.  If you are 22 or older, have a CBE every year. Also consider having a breast X-ray (mammogram) every year.  If you have a family history of breast cancer, talk to your health care provider about genetic screening.  If you are at high risk for breast cancer, talk to your health care provider about having an MRI and a mammogram every year.  Breast cancer gene (BRCA) assessment is recommended for women who have family members with BRCA-related cancers.  BRCA-related cancers include:  Breast.  Ovarian.  Tubal.  Peritoneal cancers.  Results of the assessment will determine the need for genetic counseling and BRCA1 and BRCA2 testing. Cervical Cancer Routine pelvic examinations to screen for cervical cancer are no longer recommended for nonpregnant women who are considered low risk for cancer of the pelvic organs (ovaries, uterus, and vagina) and who do not have symptoms. A pelvic examination may be necessary if you have symptoms including those associated with pelvic infections. Ask your health care provider if a screening pelvic exam is right for you.   The Pap test is the screening test for cervical cancer for women who are considered at risk.  If you had a hysterectomy for a problem that was not cancer or a condition that could lead to cancer, then you no longer need Pap tests.  If you are older than 65 years, and you have had normal Pap tests for the past 10 years, you no longer need to have Pap tests.  If you have had past treatment for cervical cancer or a condition that could lead to cancer, you need Pap tests and screening for cancer for at least 20 years after your treatment.  If you no longer get a Pap test, assess your risk factors if they change (such as having a new sexual partner). This can affect whether you should start being screened again.  Some women have medical problems that increase their chance of getting cervical cancer. If this is the case for you, your health care provider may recommend more frequent screening and Pap tests.  The human papillomavirus (HPV) test is another test that may be used for cervical cancer screening. The HPV test looks for the virus that can cause cell changes in the cervix. The cells collected during the Pap test can be tested for HPV.  The HPV test can be used to screen women 39 years of age and older. Getting tested for HPV can extend the interval between normal Pap tests from three to  five years.  An HPV test also should be used to screen women of any age who have unclear Pap test results.  After 43 years of age, women should have HPV testing as often as Pap tests.  Colorectal Cancer  This type of cancer can be detected and often prevented.  Routine colorectal cancer screening usually begins at 43 years of age and continues through 43 years of age.  Your health care provider may recommend screening at an earlier age if you have risk factors for colon cancer.  Your health care provider may also recommend using home test kits to check for hidden blood in the stool.  A small camera at the end of a tube can be used to examine your colon directly (sigmoidoscopy or colonoscopy). This is done to check for the earliest forms of colorectal cancer.  Routine screening usually begins at age 58.  Direct  examination of the colon should be repeated every 5-10 years through 42 years of age. However, you may need to be screened more often if early forms of precancerous polyps or small growths are found. Skin Cancer  Check your skin from head to toe regularly.  Tell your health care provider about any new moles or changes in moles, especially if there is a change in a mole's shape or color.  Also tell your health care provider if you have a mole that is larger than the size of a pencil eraser.  Always use sunscreen. Apply sunscreen liberally and repeatedly throughout the day.  Protect yourself by wearing long sleeves, pants, a wide-brimmed hat, and sunglasses whenever you are outside. HEART DISEASE, DIABETES, AND HIGH BLOOD PRESSURE   Have your blood pressure checked at least every 1-2 years. High blood pressure causes heart disease and increases the risk of stroke.  If you are between 26 years and 34 years old, ask your health care provider if you should take aspirin to prevent strokes.  Have regular diabetes screenings. This involves taking a blood sample to check your  fasting blood sugar level.  If you are at a normal weight and have a low risk for diabetes, have this test once every three years after 43 years of age.  If you are overweight and have a high risk for diabetes, consider being tested at a younger age or more often. PREVENTING INFECTION  Hepatitis B  If you have a higher risk for hepatitis B, you should be screened for this virus. You are considered at high risk for hepatitis B if:  You were born in a country where hepatitis B is common. Ask your health care provider which countries are considered high risk.  Your parents were born in a high-risk country, and you have not been immunized against hepatitis B (hepatitis B vaccine).  You have HIV or AIDS.  You use needles to inject street drugs.  You live with someone who has hepatitis B.  You have had sex with someone who has hepatitis B.  You get hemodialysis treatment.  You take certain medicines for conditions, including cancer, organ transplantation, and autoimmune conditions. Hepatitis C  Blood testing is recommended for:  Everyone born from [birth year] through 1963.  Anyone with known risk factors for hepatitis C. Sexually transmitted infections (STIs)  You should be screened for sexually transmitted infections (STIs) including gonorrhea and chlamydia if:  You are sexually active and are younger than 43 years of age.  You are older than 43 years of age and your health care provider tells you that you are at risk for this type of infection.  Your sexual activity has changed since you were last screened and you are at an increased risk for chlamydia or gonorrhea. Ask your health care provider if you are at risk.  If you do not have HIV, but are at risk, it may be recommended that you take a prescription medicine daily to prevent HIV infection. This is called pre-exposure prophylaxis (PrEP). You are considered at risk if:  You are sexually active and do not regularly use condoms or  know the HIV status of your partner(s).  You take drugs by injection.  You are sexually active with a partner who has HIV. Talk with your health care provider about whether you are at high risk of being infected with HIV. If you choose to begin PrEP, you should first be tested for HIV. You should then be tested  every 3 months for as long as you are taking PrEP.  PREGNANCY   If you are premenopausal and you may become pregnant, ask your health care provider about preconception counseling.  If you may become pregnant, take 400 to 800 micrograms (mcg) of folic acid every day.  If you want to prevent pregnancy, talk to your health care provider about birth control (contraception). OSTEOPOROSIS AND MENOPAUSE   Osteoporosis is a disease in which the bones lose minerals and strength with aging. This can result in serious bone fractures. Your risk for osteoporosis can be identified using a bone density scan.  If you are 67 years of age or older, or if you are at risk for osteoporosis and fractures, ask your health care provider if you should be screened.  Ask your health care provider whether you should take a calcium or vitamin D supplement to lower your risk for osteoporosis.  Menopause may have certain physical symptoms and risks.  Hormone replacement therapy may reduce some of these symptoms and risks. Talk to your health care provider about whether hormone replacement therapy is right for you.  HOME CARE INSTRUCTIONS   Schedule regular health, dental, and eye exams.  Stay current with your immunizations.   Do not use any tobacco products including cigarettes, chewing tobacco, or electronic cigarettes.  If you are pregnant, do not drink alcohol.  If you are breastfeeding, limit how much and how often you drink alcohol.  Limit alcohol intake to no more than 1 drink per day for nonpregnant women. One drink equals 12 ounces of beer, 5 ounces of wine, or 1 ounces of hard liquor.  Do  not use street drugs.  Do not share needles.  Ask your health care provider for help if you need support or information about quitting drugs.  Tell your health care provider if you often feel depressed.  Tell your health care provider if you have ever been abused or do not feel safe at home. Document Released: 06/05/2011 Document Revised: 04/06/2014 Document Reviewed: 10/22/2013 [facility] Patient Information 2015 [place], [place]. This information is not intended to replace advice given to you by your health care provider. Make sure you discuss any questions you have with your health care provider.

## 2016-06-27 NOTE — Progress Notes (Addendum)
    Jodi Short Nov 28, 1971 [id number]        45 y.o.  [id number]  for annual exam.  Several issues noted below.  Past medical history,surgical history, problem list, medications, allergies, family history and social history were all reviewed and documented as reviewed in the EPIC chart.  ROS:  Performed with pertinent positives and negatives included in the history, assessment and plan.   Additional significant findings :  Intermittent nausea, right lower quadrant pain as discussed below   Exam: [name] assistant Vitals:   06/27/16 1425  BP: 114/64  Weight: 142 lb (64.4 kg)  Height: 5\' 5"  (1.651 m)   General appearance:  Normal affect, orientation and appearance. Skin: Grossly normal HEENT: Without gross lesions.  No cervical or supraclavicular adenopathy. Thyroid normal.  Lungs:  Clear without wheezing, rales or rhonchi Cardiac: RR, without RMG Abdominal:  Soft, nontender, without masses, guarding, rebound, organomegaly or hernia Breasts:  Examined lying and sitting without masses, retractions, discharge or axillary adenopathy. Pelvic:  Ext/BUS/Vagina Normal  Cervix normal  Uterus anteverted, normal size, shape and contour, midline and mobile nontender   Adnexa without masses or tenderness    Anus and perineum normal   Rectovaginal normal sphincter tone without palpated masses or tenderness.    Assessment/Plan:  45 y.o. [id number] female for annual exam with regular menses, vasectomy birth control.   1. Intermittent right lower quadrant pain. Over the last several weeks patient has noted onset of sharp stabbing almost incapacitating pain right lower quadrant lasting seconds to minutes occurring every several hours. Unrelated to eating. No nausea or vomiting, diarrhea or constipation associated with this. No fever or chills. Good appetite otherwise. No urinary symptoms such as frequency, dysuria, urgency or low back pain.  Suspect GI. Start with ultrasound to rule out GYN  pathology. Patient will schedule follow up for this. Possible GI referral if continues. Possible laparoscopy. 2. Left sided discomfort with nausea and pre-menstrual type cramping every other month or so. Not consistently every month. Usually occurs 1-2 weeks after her menses. 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 Questionable mittelschmerz versus pathology such as endometriosis which may be also associated with #1. We'll start with ultrasound as in #1 and then go from there. Again laparoscopy reviewed with the patient. 3. Pap smear/HPV 05/2015. No Pap smear done today. No history of significant abnormal Pap smears previously. Plan repeat Pap smear at 5 year interval per current screening guidelines. 4. Mammography 11/2015. Reviewed new ACOG guidelines as well as ACS and [place] health preventative task force guidelines with the patient. Patient prefers annual mammography is and will schedule when due. SBE monthly reviewed. 5. Health maintenance. Baseline CBC, comprehensive metabolic panel, lipid profile, urinalysis ordered. Follow up for ultrasound. Otherwise annual follow up in one year.  10 minutes of my time in excess of her annual gynecologic exam was spent in direct face to face counseling and coordination of care in regards to her problems of right lower quadrant pain and intermittent left pain with nausea.  Anastasio Auerbach MD, 3:08 PM 06/27/2016

## 2016-06-28 ENCOUNTER — Encounter: Payer: Self-pay | Admitting: Gynecology

## 2016-06-28 LAB — URINALYSIS W MICROSCOPIC + REFLEX CULTURE
BILIRUBIN URINE: NEGATIVE
Bacteria, UA: NONE SEEN [HPF]
Casts: NONE SEEN [LPF]
Glucose, UA: NEGATIVE
Hgb urine dipstick: NEGATIVE
KETONES UR: NEGATIVE
NITRITE: NEGATIVE
Protein, ur: NEGATIVE
RBC / HPF: NONE SEEN RBC/HPF (ref <=2)
SPECIFIC GRAVITY, URINE: 1.026 (ref 1.001–1.035)
Yeast: NONE SEEN [HPF]
pH: 5 (ref 5.0–8.0)

## 2016-06-29 LAB — URINE CULTURE

## 2016-07-10 ENCOUNTER — Other Ambulatory Visit: Payer: Self-pay | Admitting: Gynecology

## 2016-07-10 ENCOUNTER — Ambulatory Visit (INDEPENDENT_AMBULATORY_CARE_PROVIDER_SITE_OTHER): Payer: BLUE CROSS/BLUE SHIELD | Admitting: Gynecology

## 2016-07-10 ENCOUNTER — Ambulatory Visit (INDEPENDENT_AMBULATORY_CARE_PROVIDER_SITE_OTHER): Payer: BLUE CROSS/BLUE SHIELD

## 2016-07-10 ENCOUNTER — Encounter: Payer: Self-pay | Admitting: Gynecology

## 2016-07-10 VITALS — BP 116/76

## 2016-07-10 DIAGNOSIS — R1031 Right lower quadrant pain: Secondary | ICD-10-CM | POA: Diagnosis not present

## 2016-07-10 DIAGNOSIS — N8311 Corpus luteum cyst of right ovary: Secondary | ICD-10-CM

## 2016-07-10 DIAGNOSIS — N7011 Chronic salpingitis: Secondary | ICD-10-CM

## 2016-07-10 DIAGNOSIS — R102 Pelvic and perineal pain: Secondary | ICD-10-CM

## 2016-07-10 DIAGNOSIS — D251 Intramural leiomyoma of uterus: Secondary | ICD-10-CM

## 2016-07-10 NOTE — Progress Notes (Signed)
    Rajanae Causevic 01/29/1971 [id number]        45 y.o.  [id number] presents for ultrasound. She has been having 2 separate pains. The first is right lower quadrant pain lasting for several weeks coming and going. She notes that that pain has totally disappeared. The second pain was a left-sided discomfort with sometimes nausea occurring 2 weeks before her menses not consistently every month lasting for day or so.  Prior ultrasound showed a small cystic area left adnexa questionable small hydrosalpinx versus paratubal cyst.  Past medical history,surgical history, problem list, medications, allergies, family history and social history were all reviewed and documented in the EPIC chart.  Directed ROS with pertinent positives and negatives documented in the history of present illness/assessment and plan.  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 Endometrial echo 8.5 mm. Right ovary normal with physiologic changes. Left ovary with a tubular shaped area 18 x 13 x 17 mm with no change from prior ultrasound. Cul-de-sac negative  Assessment/Plan:  45 y.o. [id number] with stable questionable left small hydrosalpinx or cyst paratubal cyst. Right-sided pain has resolved. Left sided pain sounds like mittelschmerz. We discussed this in detail and she is comfortable with observation at this point because it is not overly bothersome to her and stable. Will follow up if progresses and becomes more of an issue. Otherwise will follow up in one year for annual exam    Anastasio Auerbach MD, 12:42 PM 07/10/2016

## 2016-07-10 NOTE — Patient Instructions (Signed)
Follow up if your pain changes or worsens.  Follow up in one year for annual exam

## 2016-10-12 ENCOUNTER — Other Ambulatory Visit: Payer: Self-pay | Admitting: Gynecology

## 2016-10-12 DIAGNOSIS — Z1231 Encounter for screening mammogram for malignant neoplasm of breast: Secondary | ICD-10-CM

## 2016-11-15 ENCOUNTER — Ambulatory Visit: Payer: BLUE CROSS/BLUE SHIELD

## 2016-12-15 ENCOUNTER — Ambulatory Visit
Admission: RE | Admit: 2016-12-15 | Discharge: 2016-12-15 | Disposition: A | Discharge status: Home / Self Care | Payer: BLUE CROSS/BLUE SHIELD | Source: Ambulatory Visit | Attending: Gynecology | Admitting: Gynecology

## 2016-12-15 DIAGNOSIS — Z1231 Encounter for screening mammogram for malignant neoplasm of breast: Secondary | ICD-10-CM

## 2017-01-12 ENCOUNTER — Encounter (HOSPITAL_COMMUNITY): Payer: Self-pay | Admitting: Emergency Medicine

## 2017-01-12 ENCOUNTER — Emergency Department (HOSPITAL_COMMUNITY): Payer: BLUE CROSS/BLUE SHIELD

## 2017-01-12 ENCOUNTER — Emergency Department (HOSPITAL_COMMUNITY)
Admission: EM | Admit: 2017-01-12 | Discharge: 2017-01-12 | Disposition: A | Discharge status: Home / Self Care | Payer: BLUE CROSS/BLUE SHIELD | Attending: Emergency Medicine | Admitting: Emergency Medicine

## 2017-01-12 DIAGNOSIS — Z79899 Other long term (current) drug therapy: Secondary | ICD-10-CM | POA: Diagnosis not present

## 2017-01-12 DIAGNOSIS — Y92410 Unspecified street and highway as the place of occurrence of the external cause: Secondary | ICD-10-CM | POA: Insufficient documentation

## 2017-01-12 DIAGNOSIS — Y999 Unspecified external cause status: Secondary | ICD-10-CM | POA: Insufficient documentation

## 2017-01-12 DIAGNOSIS — I10 Essential (primary) hypertension: Secondary | ICD-10-CM | POA: Insufficient documentation

## 2017-01-12 DIAGNOSIS — S39012A Strain of muscle, fascia and tendon of lower back, initial encounter: Secondary | ICD-10-CM | POA: Insufficient documentation

## 2017-01-12 DIAGNOSIS — S161XXA Strain of muscle, fascia and tendon at neck level, initial encounter: Secondary | ICD-10-CM | POA: Insufficient documentation

## 2017-01-12 DIAGNOSIS — Y939 Activity, unspecified: Secondary | ICD-10-CM | POA: Diagnosis not present

## 2017-01-12 DIAGNOSIS — S3992XA Unspecified injury of lower back, initial encounter: Secondary | ICD-10-CM | POA: Diagnosis present

## 2017-01-12 MED ORDER — HYDROCODONE-ACETAMINOPHEN 5-325 MG PO TABS: 1.0000 | ORAL_TABLET | Freq: Four times a day (QID) | ORAL | 0 refills | Status: DC | PRN

## 2017-01-12 MED ORDER — IBUPROFEN 800 MG PO TABS: 800.0000 mg | ORAL_TABLET | Freq: Three times a day (TID) | ORAL | 0 refills | Status: DC

## 2017-01-12 MED ORDER — ONDANSETRON 4 MG PO TBDP
4.0000 mg | ORAL_TABLET | Freq: Once | ORAL | Status: AC | PRN
Start: 2017-01-12 — End: 2017-01-12
  Administered 2017-01-12: 4 mg via ORAL
  Filled 2017-01-12: qty 1

## 2017-01-12 MED ORDER — IBUPROFEN 800 MG PO TABS
800.0000 mg | ORAL_TABLET | Freq: Once | ORAL | Status: AC
  Administered 2017-01-12: 800 mg via ORAL
  Filled 2017-01-12: qty 1

## 2017-01-12 MED ORDER — ONDANSETRON HCL 4 MG PO TABS: 4.0000 mg | ORAL_TABLET | Freq: Four times a day (QID) | ORAL | 0 refills | Status: DC

## 2017-01-12 MED ORDER — METHOCARBAMOL 500 MG PO TABS: 500.0000 mg | ORAL_TABLET | Freq: Two times a day (BID) | ORAL | 0 refills | Status: DC
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
## 2017-01-12 NOTE — ED Notes (Signed)
I am unable to obtain discharge vital signs per pt request to leave promptly. I have however reviewed discharge teaching and Rx.
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
## 2017-07-09 ENCOUNTER — Encounter: Payer: BLUE CROSS/BLUE SHIELD | Admitting: Gynecology

## 2017-08-03 ENCOUNTER — Encounter: Payer: Self-pay | Admitting: Gynecology

## 2017-08-03 ENCOUNTER — Ambulatory Visit (INDEPENDENT_AMBULATORY_CARE_PROVIDER_SITE_OTHER): Payer: BLUE CROSS/BLUE SHIELD | Admitting: Gynecology

## 2017-08-03 VITALS — BP 124/80 | Ht 65.0 in | Wt 148.0 lb

## 2017-08-03 DIAGNOSIS — N941 Unspecified dyspareunia: Secondary | ICD-10-CM | POA: Diagnosis not present

## 2017-08-03 DIAGNOSIS — L680 Hirsutism: Secondary | ICD-10-CM | POA: Diagnosis not present

## 2017-08-03 DIAGNOSIS — Z01419 Encounter for gynecological examination (general) (routine) without abnormal findings: Secondary | ICD-10-CM | POA: Diagnosis not present

## 2017-08-03 NOTE — Progress Notes (Addendum)
Jodi Short Apr 06, 1971 [id number]        46 y.o.  [id number] for annual exam.  Patient also complaining of deep dyspareunia approximately 50% of the time. Described as discomfort generalized in the pelvis not localizing to any specific area. Not associated with any specific position. Has been present for approximately 6 months and appears to be getting somewhat worse. No superficial symptoms such as vaginal irritation or discharge. Feels lubrication is adequate. Also notes some hair growth on her chin as bothersome to her. Finds herself tweezering to control. No acne, weight gain or other skin complaints.  Past medical history,surgical history, problem list, medications, allergies, family history and social history were all reviewed and documented as reviewed in the EPIC chart.  ROS:  Performed with pertinent positives and negatives included in the history, assessment and plan.   Additional significant findings :  None   Exam: Jodi Short assistant Vitals:   08/03/17 0800  BP: 124/80  Weight: 148 lb (67.1 kg)  Height: 5\' 5"  (1.651 m)   Body mass index is 24.63 kg/m.  General appearance:  Normal affect, orientation and appearance. Skin: Grossly normal HEENT: Without gross lesions.  No cervical or supraclavicular adenopathy. Thyroid normal.  Lungs:  Clear without wheezing, rales or rhonchi Cardiac: RR, without RMG Abdominal:  Soft, nontender, without masses, guarding, rebound, organomegaly or hernia Breasts:  Examined lying and sitting without masses, retractions, discharge or axillary adenopathy. Pelvic:  Ext, BUS, Vagina: Normal with multiple upper left mons pubis  Cervix: Normal  Uterus: Anteverted, normal size, shape and contour, midline and mobile nontender   Adnexa: Without masses or tenderness    Anus and perineum: Normal   Rectovaginal: Normal sphincter tone without palpated masses or tenderness.    Assessment/Plan:  46 y.o. [id number] female for annual exam with regular  menses, vasectomy birth control.   1. Dyspareunia, deep. Exam is normal. Occurs 50% of the time.  Had ultrasound last year which showed left tubular structure 18 x 13 x 17 mm stable from a prior ultrasound. Reviewed differential to include positional, tiring during cycle, adhesions, endometriosis, possible hydrosalpinx. Recommend follow up ultrasound now to relook at this area and a general pelvic surveillance. Asked patient to keep a pain calendar as to when the pain occurs as far as timing in her cycle, position, any particular aspects of that interaction such as deeper penetration all to be kept track of and see if there is a recurrent precipitating factor. 2. Hirsutism. Exam does not show significant abnormal hair growth although she has been tweezing. No other abnormal hair growth such as chest, nipples, lower abdomen. No androgenic symptoms such as acne, weight gain, hair loss. Discussed hormonal changes with aging and I suspect this is more ovarian androgen related. Options for management to include cosmetic treatments, ovarian suppression such as low-dose oral contraceptives discussed. Do not feel more involved androgenic workup needed given the lack of any other symptoms. Patient's comfortable with following for now. 3. Mammography 12/2016. Continue with annual mammography when due. Breast exam normal today. 4. Pap smear/HPV 2016 . No Pap smear done today. No history of abnormal Pap smears. Plan repeat Pap smear at 5 year interval per current screening guidelines. 5. Health maintenance. No routine lab work done as patient plans to have this done through her primary physician's office. Follow up for the ultrasound otherwise follow up in one year for annual exam.  Additional time in excess of her routine gynecologic exam was spent in direct  face to face counseling and coordination of care in regards to her dyspareunia and hirsutism.     Jodi Auerbach MD, 8:22 AM 08/03/2017

## 2017-08-03 NOTE — Patient Instructions (Signed)
Follow up for ultrasound as scheduled 

## 2017-08-29 ENCOUNTER — Ambulatory Visit: Payer: BLUE CROSS/BLUE SHIELD | Admitting: Gynecology

## 2017-08-29 ENCOUNTER — Other Ambulatory Visit: Payer: BLUE CROSS/BLUE SHIELD

## 2017-09-06 ENCOUNTER — Other Ambulatory Visit: Payer: BLUE CROSS/BLUE SHIELD

## 2017-09-06 ENCOUNTER — Ambulatory Visit: Payer: BLUE CROSS/BLUE SHIELD | Admitting: Gynecology

## 2017-09-24 ENCOUNTER — Ambulatory Visit: Payer: BLUE CROSS/BLUE SHIELD | Admitting: Gynecology

## 2017-09-24 ENCOUNTER — Other Ambulatory Visit: Payer: BLUE CROSS/BLUE SHIELD

## 2017-10-03 ENCOUNTER — Ambulatory Visit: Payer: BLUE CROSS/BLUE SHIELD | Admitting: Gynecology

## 2017-10-03 ENCOUNTER — Other Ambulatory Visit: Payer: BLUE CROSS/BLUE SHIELD

## 2017-10-18 ENCOUNTER — Ambulatory Visit: Payer: BLUE CROSS/BLUE SHIELD | Admitting: Gynecology

## 2017-10-18 ENCOUNTER — Other Ambulatory Visit: Payer: BLUE CROSS/BLUE SHIELD

## 2017-11-29 ENCOUNTER — Other Ambulatory Visit: Payer: Self-pay | Admitting: Gynecology

## 2017-11-29 DIAGNOSIS — Z1231 Encounter for screening mammogram for malignant neoplasm of breast: Secondary | ICD-10-CM

## 2017-12-17 ENCOUNTER — Ambulatory Visit
Admission: RE | Admit: 2017-12-17 | Discharge: 2017-12-17 | Disposition: A | Discharge status: Home / Self Care | Payer: Managed Care, Other (non HMO) | Source: Ambulatory Visit | Attending: Gynecology | Admitting: Gynecology

## 2017-12-17 DIAGNOSIS — Z1231 Encounter for screening mammogram for malignant neoplasm of breast: Secondary | ICD-10-CM

## 2017-12-31 ENCOUNTER — Other Ambulatory Visit: Payer: Self-pay | Admitting: Gynecology

## 2017-12-31 ENCOUNTER — Ambulatory Visit (INDEPENDENT_AMBULATORY_CARE_PROVIDER_SITE_OTHER): Payer: Managed Care, Other (non HMO) | Admitting: Gynecology

## 2017-12-31 ENCOUNTER — Encounter: Payer: Self-pay | Admitting: Gynecology

## 2017-12-31 ENCOUNTER — Ambulatory Visit (INDEPENDENT_AMBULATORY_CARE_PROVIDER_SITE_OTHER): Payer: Managed Care, Other (non HMO)

## 2017-12-31 VITALS — BP 116/74

## 2017-12-31 DIAGNOSIS — N941 Unspecified dyspareunia: Secondary | ICD-10-CM | POA: Diagnosis not present

## 2017-12-31 DIAGNOSIS — D251 Intramural leiomyoma of uterus: Secondary | ICD-10-CM | POA: Diagnosis not present

## 2017-12-31 DIAGNOSIS — N831 Corpus luteum cyst of ovary, unspecified side: Secondary | ICD-10-CM | POA: Diagnosis not present

## 2017-12-31 DIAGNOSIS — N7011 Chronic salpingitis: Secondary | ICD-10-CM

## 2017-12-31 DIAGNOSIS — D259 Leiomyoma of uterus, unspecified: Secondary | ICD-10-CM

## 2017-12-31 NOTE — Patient Instructions (Signed)
Follow-up for annual exam when due.

## 2017-12-31 NOTE — Progress Notes (Signed)
    Jodi Short 08/25/1971 [id number]        46 y.o.  [id number] presents for ultrasound.  History of deep dyspareunia intermittently.  Notes it varies throughout the month but not all the time.  Past medical history,surgical history, problem list, medications, allergies, family history and social history were all reviewed and documented in the EPIC chart.  Directed ROS with pertinent positives and negatives documented in the history of present illness/assessment and plan.  Exam: Vitals:   12/31/17 1218  BP: 116/74   General appearance:  Normal  Ultrasound transvaginal shows uterus normal size and echotexture.  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  Cystic lumen with some degeneration.  Endometrial echo 3.5 mm.  Right ovary with corpus luteal cyst 14 x 16 mm.  Small follicle 14 x 9 mm.  Left ovary normal with adjacent tubular cystic area 28 x 19 x 13 mm.  Negative color flow Doppler.  Cul-de-sac negative.  Assessment/Plan:  47 y.o. [id number] with small myoma and small probable hydrosalpinx left adnexa.  Has been seen on ultrasounds on the past and remained stable since initially noted 2014.  Discussed the patient's dyspareunia and options for management to include continued observation given that is intermittent and not consistent.  Alternatives to include diagnostic laparoscopy to assess for other pathology such as adhesions or endometriosis.  Lastly hysterectomy as definitive treatment also reviewed.  Patient is not ready to progress to surgery at this point but prefers observation.  We will follow-up if she wants to rediscuss possible surgery but at this point is comfortable just following.  I reviewed the left probable small hydrosalpinx.  Has remained stable over years observation we both are comfortable with no further intervention.    Anastasio Auerbach MD, 12:53 PM 12/31/2017

## 2018-10-03 ENCOUNTER — Encounter: Payer: Self-pay | Admitting: Gynecology

## 2018-10-03 ENCOUNTER — Ambulatory Visit (INDEPENDENT_AMBULATORY_CARE_PROVIDER_SITE_OTHER): Payer: Managed Care, Other (non HMO) | Admitting: Gynecology

## 2018-10-03 VITALS — BP 122/74 | Ht 64.0 in | Wt 153.0 lb

## 2018-10-03 DIAGNOSIS — Z01419 Encounter for gynecological examination (general) (routine) without abnormal findings: Secondary | ICD-10-CM

## 2018-10-03 NOTE — Patient Instructions (Signed)
Follow-up in 1 year for annual exam, sooner as needed. 

## 2018-10-03 NOTE — Progress Notes (Signed)
    Jodi Short [birth year] [id number]        47 y.o.  [id number] for annual gynecologic exam.  Without gynecologic complaints.  Was evaluated earlier this year with dyspareunia but notes that this has resolved.    Past medical history,surgical history, problem list, medications, allergies, family history and social history were all reviewed and documented as reviewed in the EPIC chart.  ROS:  Performed with pertinent positives and negatives included in the history, assessment and plan.   Additional significant findings : None   Exam: [name] assistant Vitals:   10/03/18 1105  BP: 122/74  Weight: 153 lb (69.4 kg)  Height: 5\' 4"  (1.626 m)   Body mass index is 26.26 kg/m.  General appearance:  Normal affect, orientation and appearance. Skin: Grossly normal HEENT: Without gross lesions.  No cervical or supraclavicular adenopathy. Thyroid normal.  Lungs:  Clear without wheezing, rales or rhonchi Cardiac: RR, without RMG Abdominal:  Soft, nontender, without masses, guarding, rebound, organomegaly or hernia Breasts:  Examined lying and sitting without masses, retractions, discharge or axillary adenopathy. Pelvic:  Ext, BUS, Vagina: Normal  Cervix: Normal.  Pap smear done  Uterus: Anteverted, normal size, shape and contour, midline and mobile nontender   Adnexa: Without masses or tenderness    Anus and perineum: Normal   Rectovaginal: Normal sphincter tone without palpated masses or tenderness.    Assessment/Plan:  47 y.o. [id number] female for annual gynecologic exam with regular menses, vasectomy birth control.   1. Small left probable hydrosalpinx.  Has been followed ultrasonographically with no change since 2014.  Last measurement earlier this year 28 x 19 x 13 mm.  We both have decided to stop screening ultrasonographically unless patient has developed symptoms such as pain.  Exam today is normal.  Her prior pelvic pain she was having earlier this year has resolved. 2. Mammography  coming due in January and I reminded her to schedule this.  Breast exam normal today. 3. Pap smear/HPV 05/2015.  Pap smear done today.  Patient uncomfortable with current screening guidelines and preferred a more frequent Pap smear monitoring.  No history of abnormal Pap smears previously. 4. Health maintenance.  No lab work done today.  Patient is going to make an appointment to see her primary physician for general health checkup and lab work.  Follow-up in 1 year, sooner as needed.   Anastasio Auerbach MD, 11:25 AM 10/03/2018

## 2018-10-03 NOTE — Addendum Note (Signed)
Addended by: Nelva Nay on: 10/03/2018 11:50 AM   Modules accepted: Orders

## 2018-10-04 LAB — PAP IG W/ RFLX HPV ASCU

## 2018-11-18 ENCOUNTER — Other Ambulatory Visit: Payer: Self-pay | Admitting: Gynecology

## 2018-11-18 DIAGNOSIS — Z1231 Encounter for screening mammogram for malignant neoplasm of breast: Secondary | ICD-10-CM

## 2018-12-25 ENCOUNTER — Ambulatory Visit
Admission: RE | Admit: 2018-12-25 | Discharge: 2018-12-25 | Disposition: A | Discharge status: Home / Self Care | Payer: Managed Care, Other (non HMO) | Source: Ambulatory Visit | Attending: Gynecology | Admitting: Gynecology

## 2018-12-25 DIAGNOSIS — Z1231 Encounter for screening mammogram for malignant neoplasm of breast: Secondary | ICD-10-CM

## 2019-08-26 ENCOUNTER — Encounter: Payer: Self-pay | Admitting: Gynecology

## 2019-11-25 ENCOUNTER — Other Ambulatory Visit: Payer: Self-pay

## 2019-11-26 ENCOUNTER — Encounter: Payer: Self-pay | Admitting: Gynecology

## 2019-11-26 ENCOUNTER — Ambulatory Visit (INDEPENDENT_AMBULATORY_CARE_PROVIDER_SITE_OTHER): Payer: Managed Care, Other (non HMO) | Admitting: Gynecology

## 2019-11-26 VITALS — BP 124/80 | Ht 64.0 in | Wt 162.0 lb

## 2019-11-26 DIAGNOSIS — Z01419 Encounter for gynecological examination (general) (routine) without abnormal findings: Secondary | ICD-10-CM | POA: Diagnosis not present

## 2019-11-26 DIAGNOSIS — Z13220 Encounter for screening for lipoid disorders: Secondary | ICD-10-CM

## 2019-11-26 NOTE — Progress Notes (Addendum)
    Jodi Short Jan 20, 1971 [id number]        48 y.o.  [id number] for annual gynecologic exam.  Without gynecologic complaints  Past medical history,surgical history, problem list, medications, allergies, family history and social history were all reviewed and documented as reviewed in the EPIC chart.  ROS:  Performed with pertinent positives and negatives included in the history, assessment and plan.   Additional significant findings : None   Exam: Wandra Scot assistant Vitals:   11/26/19 1414  BP: 124/80  Weight: 162 lb (73.5 kg)  Height: 5\' 4"  (1.626 m)   Body mass index is 27.81 kg/m.  General appearance:  Normal affect, orientation and appearance. Skin: Grossly normal HEENT: Without gross lesions.  No cervical or supraclavicular adenopathy. Thyroid normal.  Lungs:  Clear without wheezing, rales or rhonchi Cardiac: RR, without RMG Abdominal:  Soft, nontender, without masses, guarding, rebound, organomegaly or hernia Breasts:  Examined lying and sitting without masses, retractions, discharge or axillary adenopathy. Pelvic:  Ext, BUS, Vagina: Normal  Cervix: Normal  Uterus: Anteverted, normal size, shape and contour, midline and mobile nontender   Adnexa: Without masses or tenderness    Anus and perineum: Normal   Rectovaginal: Normal sphincter tone without palpated masses or tenderness.    Assessment/Plan:  48 y.o. [id number] female for annual gynecologic exam.  With regular menses, vasectomy birth control  1. Perimenopause.  Patient notes that her menses are a little bit heavier.  We discussed what to expect in the perimenopause.  They remain on time with no intermenstrual bleeding.  No other menopausal symptoms such as hot flushes or sweats.  Will keep a menstrual calendar and as long as they remain regular will follow.  If significant irregularity or heaviness worsens will represent for evaluation.  She is having some menstrual migraines with migraine-like headache starting a  day before or day of her menses.  Consistently occurs each month.  Options for management reviewed to include ibuprofen 800 mg at the earliest onset of headache, migraine medication such as Imitrex and low dose estrogen supplementation such as a patch 2 days before the onset of her menses for several days.  Patient wants to try the ibuprofen 800 mg we will follow-up if it continues to be a problem. 2. Mammography coming due in January and she is going to schedule this.  Breast exam normal today. 3. Pap smear 2019.  Pap smear/HPV 2016.  No Pap smear done today.  No history of significant abnormal Pap smears. 4. Health maintenance.  Has not had routine blood work done over the past year or so.  CBC, CMP and lipid profile ordered.  Follow-up 1 year, sooner as needed.   Anastasio Auerbach MD, 2:38 PM 11/26/2019

## 2019-11-26 NOTE — Patient Instructions (Signed)
Follow-up in 1 year for annual exam, sooner as needed. 

## 2019-11-27 LAB — CBC WITH DIFFERENTIAL/PLATELET
Absolute Monocytes: 409 cells/uL (ref 200–950)
Basophils Absolute: 128 cells/uL (ref 0–200)
Basophils Relative: 2.1 %
Eosinophils Absolute: 61 cells/uL (ref 15–500)
Eosinophils Relative: 1 %
HCT: 40.9 % (ref 35.0–45.0)
Hemoglobin: 14.4 g/dL (ref 11.7–15.5)
Lymphs Abs: 1665 cells/uL (ref 850–3900)
MCH: 33.2 pg — ABNORMAL HIGH (ref 27.0–33.0)
MCHC: 35.2 g/dL (ref 32.0–36.0)
MCV: 94.2 fL (ref 80.0–100.0)
MPV: 12.3 fL (ref 7.5–12.5)
Monocytes Relative: 6.7 %
Neutro Abs: 3837 cells/uL (ref 1500–7800)
Neutrophils Relative %: 62.9 %
Platelets: 209 10*3/uL (ref 140–400)
RBC: 4.34 10*6/uL (ref 3.80–5.10)
RDW: 12.1 % (ref 11.0–15.0)
Total Lymphocyte: 27.3 %
WBC: 6.1 10*3/uL (ref 3.8–10.8)

## 2019-11-27 LAB — COMPREHENSIVE METABOLIC PANEL
AG Ratio: 1.6 (calc) (ref 1.0–2.5)
ALT: 12 U/L (ref 6–29)
AST: 15 U/L (ref 10–35)
Albumin: 4.3 g/dL (ref 3.6–5.1)
Alkaline phosphatase (APISO): 52 U/L (ref 31–125)
BUN/Creatinine Ratio: 13 (calc) (ref 6–22)
BUN: 14 mg/dL (ref 7–25)
CO2: 21 mmol/L (ref 20–32)
Calcium: 9 mg/dL (ref 8.6–10.2)
Chloride: 106 mmol/L (ref 98–110)
Creat: 1.11 mg/dL — ABNORMAL HIGH (ref 0.50–1.10)
Globulin: 2.7 g/dL (calc) (ref 1.9–3.7)
Glucose, Bld: 100 mg/dL — ABNORMAL HIGH (ref 65–99)
Potassium: 4.3 mmol/L (ref 3.5–5.3)
Sodium: 139 mmol/L (ref 135–146)
Total Bilirubin: 0.5 mg/dL (ref 0.2–1.2)
Total Protein: 7 g/dL (ref 6.1–8.1)

## 2019-11-27 LAB — LIPID PANEL
Cholesterol: 204 mg/dL — ABNORMAL HIGH (ref <200)
HDL: 64 mg/dL (ref >=50)
LDL Cholesterol (Calc): 118 mg/dL (calc) — ABNORMAL HIGH
Non-HDL Cholesterol (Calc): 140 mg/dL (calc) — ABNORMAL HIGH (ref <130)
Total CHOL/HDL Ratio: 3.2 (calc) (ref <5.0)
Triglycerides: 117 mg/dL (ref <150)

## 2019-12-01 ENCOUNTER — Encounter: Payer: Self-pay | Admitting: Gynecology

## 2019-12-09 ENCOUNTER — Other Ambulatory Visit: Payer: Self-pay | Admitting: Obstetrics & Gynecology

## 2019-12-30 ENCOUNTER — Other Ambulatory Visit: Payer: Self-pay | Admitting: Obstetrics and Gynecology

## 2019-12-30 DIAGNOSIS — Z1231 Encounter for screening mammogram for malignant neoplasm of breast: Secondary | ICD-10-CM

## 2020-02-04 ENCOUNTER — Ambulatory Visit
Admission: RE | Admit: 2020-02-04 | Discharge: 2020-02-04 | Disposition: A | Discharge status: Home / Self Care | Payer: Managed Care, Other (non HMO) | Source: Ambulatory Visit | Attending: Obstetrics and Gynecology | Admitting: Obstetrics and Gynecology

## 2020-02-04 ENCOUNTER — Other Ambulatory Visit: Payer: Self-pay

## 2020-02-04 DIAGNOSIS — Z1231 Encounter for screening mammogram for malignant neoplasm of breast: Secondary | ICD-10-CM

## 2020-11-29 ENCOUNTER — Encounter: Payer: Managed Care, Other (non HMO) | Admitting: Obstetrics and Gynecology

## 2020-12-08 ENCOUNTER — Encounter: Payer: Managed Care, Other (non HMO) | Admitting: Obstetrics and Gynecology

## 2020-12-20 ENCOUNTER — Ambulatory Visit: Payer: Managed Care, Other (non HMO) | Admitting: Obstetrics and Gynecology

## 2020-12-23 ENCOUNTER — Ambulatory Visit: Payer: Managed Care, Other (non HMO) | Admitting: Obstetrics and Gynecology

## 2021-01-14 ENCOUNTER — Other Ambulatory Visit: Payer: Self-pay | Admitting: Obstetrics and Gynecology

## 2021-01-14 DIAGNOSIS — Z1231 Encounter for screening mammogram for malignant neoplasm of breast: Secondary | ICD-10-CM

## 2021-03-08 ENCOUNTER — Other Ambulatory Visit: Payer: Self-pay

## 2021-03-08 ENCOUNTER — Ambulatory Visit
Admission: RE | Admit: 2021-03-08 | Discharge: 2021-03-08 | Disposition: A | Discharge status: Home / Self Care | Payer: Managed Care, Other (non HMO) | Source: Ambulatory Visit | Attending: Obstetrics and Gynecology | Admitting: Obstetrics and Gynecology

## 2021-03-08 DIAGNOSIS — Z1231 Encounter for screening mammogram for malignant neoplasm of breast: Secondary | ICD-10-CM

## 2021-05-20 ENCOUNTER — Ambulatory Visit: Payer: Managed Care, Other (non HMO) | Admitting: Obstetrics & Gynecology

## 2021-07-21 ENCOUNTER — Encounter: Payer: Self-pay | Admitting: Obstetrics & Gynecology

## 2021-07-21 ENCOUNTER — Ambulatory Visit (INDEPENDENT_AMBULATORY_CARE_PROVIDER_SITE_OTHER): Payer: Managed Care, Other (non HMO) | Admitting: Obstetrics & Gynecology

## 2021-07-21 ENCOUNTER — Other Ambulatory Visit: Payer: Self-pay

## 2021-07-21 ENCOUNTER — Other Ambulatory Visit (HOSPITAL_COMMUNITY)
Admission: RE | Admit: 2021-07-21 | Discharge: 2021-07-21 | Disposition: A | Discharge status: Home / Self Care | Payer: Managed Care, Other (non HMO) | Source: Ambulatory Visit | Attending: Obstetrics & Gynecology | Admitting: Obstetrics & Gynecology

## 2021-07-21 VITALS — BP 114/70 | HR 78 | Resp 16 | Ht 64.0 in | Wt 170.0 lb

## 2021-07-21 DIAGNOSIS — Z01419 Encounter for gynecological examination (general) (routine) without abnormal findings: Secondary | ICD-10-CM | POA: Diagnosis not present

## 2021-07-21 DIAGNOSIS — N921 Excessive and frequent menstruation with irregular cycle: Secondary | ICD-10-CM

## 2021-07-21 DIAGNOSIS — R635 Abnormal weight gain: Secondary | ICD-10-CM

## 2021-07-21 DIAGNOSIS — Z9189 Other specified personal risk factors, not elsewhere classified: Secondary | ICD-10-CM | POA: Diagnosis not present

## 2021-07-21 DIAGNOSIS — N951 Menopausal and female climacteric states: Secondary | ICD-10-CM | POA: Diagnosis not present

## 2021-07-21 LAB — TSH: TSH: 2.17 mIU/L

## 2021-07-21 LAB — FOLLICLE STIMULATING HORMONE: FSH: 37.2 m[IU]/mL

## 2021-07-21 NOTE — Progress Notes (Signed)
Uldene Serfass [birth year] [id number]   History:    50 y.o. G3P2A1L2  Married.  Vasectomy  RP:  Established patient presenting for annual gyn exam   HPI: Perimenopause.  Patient notes that her menses are heavier every 4-6 weeks.  Occasional hot flushes.  Worsened PMS with tender breasts and H/As.  No pelvic pain.  No pain with IC.  Breasts normal currently.  Mammography.  Pap smear 2019.   Health labs with family physician.  Past medical history,surgical history, family history and social history were all reviewed and documented in the EPIC chart.  Gynecologic History Patient's last menstrual period was 07/14/2021 (exact date).  Obstetric History OB History  Gravida Para Term Preterm AB Living  '3 2 2   1 2  '$ SAB IAB Ectopic Multiple Live Births  1            # Outcome Date GA Lbr Len/2nd Weight Sex Delivery Anes PTL Lv  3 SAB           2 Term           1 Term              ROS: A ROS was performed and pertinent positives and negatives are included in the history.  GENERAL: No fevers or chills. HEENT: No change in vision, no earache, sore throat or sinus congestion. NECK: No pain or stiffness. CARDIOVASCULAR: No chest pain or pressure. No palpitations. PULMONARY: No shortness of breath, cough or wheeze. GASTROINTESTINAL: No abdominal pain, nausea, vomiting or diarrhea, melena or bright red blood per rectum. GENITOURINARY: No urinary frequency, urgency, hesitancy or dysuria. MUSCULOSKELETAL: No joint or muscle pain, no back pain, no recent trauma. DERMATOLOGIC: No rash, no itching, no lesions. ENDOCRINE: No polyuria, polydipsia, no heat or cold intolerance. No recent change in weight. HEMATOLOGICAL: No anemia or easy bruising or bleeding. NEUROLOGIC: No headache, seizures, numbness, tingling or weakness. PSYCHIATRIC: No depression, no loss of interest in normal activity or change in sleep pattern.     Exam:   BP 114/70   Pulse 78   Resp 16   Ht '5\' 4"'$  (1.626 m)   Wt 170 lb (77.1  kg)   LMP 07/14/2021 (Exact Date)   BMI 29.18 kg/m   Body mass index is 29.18 kg/m.  General appearance : Well developed well nourished female. No acute distress HEENT: Eyes: no retinal hemorrhage or exudates,  Neck supple, trachea midline, no carotid bruits, no thyroidmegaly Lungs: Clear to auscultation, no rhonchi or wheezes, or rib retractions  Heart: Regular rate and rhythm, no murmurs or gallops Breast:Examined in sitting and supine position were symmetrical in appearance, no palpable masses or tenderness,  no skin retraction, no nipple inversion, no nipple discharge, no skin discoloration, no axillary or supraclavicular lymphadenopathy Abdomen: no palpable masses or tenderness, no rebound or guarding Extremities: no edema or skin discoloration or tenderness  Pelvic: Vulva: Normal             Vagina: No gross lesions or discharge  Cervix: No gross lesions or discharge.  Pap reflex done.  Uterus  AV, normal size, shape and consistency, non-tender and mobile  Adnexa  Without masses or tenderness  Anus: Normal   Assessment/Plan:  49 y.o. female for annual exam   1. Encounter for routine gynecological examination with Papanicolaou smear of cervix Normal gynecologic exam.  Pap reflex done.  Breast exam normal.  Screening mammogram April 2022 was negative.  Colonoscopy June 2022 showed polyps,  a repeat colonoscopy will be done in 1 year.  Health labs with family physician.  Body mass index 29.18. - Cytology - PAP( [facility])  2. Relies on partner vasectomy for contraception  3. Menorrhagia with irregular cycle Menorrhagia with irregular cycles.  We will proceed with a pelvic ultrasound to rule out endometrial pathology. - US Transvaginal Non-OB; Future  4. Perimenopause Oligomenorrhea with menorrhagia.  Probably perimenopausal.  Will do an [place] and TSH today.  Follow-up pelvic ultrasound. - FSH - TSH  5. Weight gain Complaints of weight gain.  Will verify thyroid function  today with a TSH. - TSH  Other orders - lisinopril (ZESTRIL) 2.5 MG tablet; Take 2.5 mg by mouth daily.   Princess Bruins MD, 4:03 PM 07/21/2021

## 2021-07-22 LAB — CYTOLOGY - PAP: Diagnosis: NEGATIVE

## 2021-07-24 ENCOUNTER — Encounter: Payer: Self-pay | Admitting: Obstetrics & Gynecology

## 2021-08-18 ENCOUNTER — Other Ambulatory Visit: Payer: Managed Care, Other (non HMO) | Admitting: Obstetrics & Gynecology

## 2021-08-18 ENCOUNTER — Other Ambulatory Visit: Payer: Managed Care, Other (non HMO)

## 2021-09-29 ENCOUNTER — Other Ambulatory Visit: Payer: Managed Care, Other (non HMO) | Admitting: Obstetrics & Gynecology

## 2021-09-29 ENCOUNTER — Other Ambulatory Visit: Payer: Managed Care, Other (non HMO)

## 2021-10-17 ENCOUNTER — Other Ambulatory Visit: Payer: Self-pay | Admitting: Obstetrics & Gynecology

## 2021-12-06 ENCOUNTER — Ambulatory Visit (INDEPENDENT_AMBULATORY_CARE_PROVIDER_SITE_OTHER): Payer: Managed Care, Other (non HMO) | Admitting: Obstetrics & Gynecology

## 2021-12-06 ENCOUNTER — Other Ambulatory Visit: Payer: Self-pay

## 2021-12-06 ENCOUNTER — Encounter: Payer: Self-pay | Admitting: Obstetrics & Gynecology

## 2021-12-06 ENCOUNTER — Ambulatory Visit (INDEPENDENT_AMBULATORY_CARE_PROVIDER_SITE_OTHER): Payer: Managed Care, Other (non HMO)

## 2021-12-06 VITALS — BP 118/80 | HR 77 | Resp 16

## 2021-12-06 DIAGNOSIS — Z9189 Other specified personal risk factors, not elsewhere classified: Secondary | ICD-10-CM | POA: Diagnosis not present

## 2021-12-06 DIAGNOSIS — N921 Excessive and frequent menstruation with irregular cycle: Secondary | ICD-10-CM | POA: Diagnosis not present

## 2021-12-06 DIAGNOSIS — N951 Menopausal and female climacteric states: Secondary | ICD-10-CM | POA: Diagnosis not present

## 2021-12-06 NOTE — Progress Notes (Signed)
° ° °  Jodi Short 04/09/1971 [id number]        51 y.o.  [id number]  Married.  Vasectomy.  RP: Menometrorrhagia for Pelvic US  HPI: Menometrorrhagia, but LMP with normal flow 11/25/2021.  No pelvic pain.  Husband has a Vasectomy.  [facility] 07/21/2021 was at 37.2.   OB History  Gravida Para Term Preterm AB Living  3 2 2   1 2   SAB IAB Ectopic Multiple Live Births  1            # Outcome Date GA Lbr Len/2nd Weight Sex Delivery Anes PTL Lv  3 SAB           2 Term           1 Term             Past medical history,surgical history, problem list, medications, allergies, family history and social history were all reviewed and documented in the EPIC chart.   Directed ROS with pertinent positives and negatives documented in the history of present illness/assessment and plan.  Exam:  Vitals:   12/06/21 1611  BP: 118/80  Pulse: 77  Resp: 16   General appearance:  Normal  Pelvic US today: Comparison is made with previous scan in January 2019.  T/V images.  Anteverted uterus normal in size and shape with a single intramural fibroid posteriorly measured at 1.6 cm which is decreased in size since the previous scan.  The overall uterine size is measured at 9.93 x 6.48 x 5.07 cm.  The endometrial lining is normal measured at 8.9 mm with no mass or thickening or abnormal blood flow seen.  The right ovary presents to small simple follicles.  The left ovary is small with no visible follicle.  The small fluid-filled structure in the left adnexa separate from the ovary is unchanged.  No free fluid in the pelvis.   Assessment/Plan:  51 y.o. [id number]   1. Menorrhagia with irregular cycle LMP 11/25/2021 was normal.  Pelvic US findings thoroughly reviewed with patient.  Normal endometrial line at 8.9 mm.  Small IM Myoma 1.6 cm reduced in size x last Pelvic US.  Patient reassured.  Given Perimenopause, LMP normal, Pelvic US reassuring and Vasectomy for contraception, prefers to observe at this time.  If  develops heavy irregular bleeding, will contact me for treatment.  The Progestin only pill and the Progesterone IUD were discussed.  Patient will do a CBC with her Fam MD.  2. Relies on partner vasectomy for contraception  3. Perimenopause FSH at 37.2 on 07/21/2021.  TSH was normal.  Other orders - tretinoin (RETIN-A) 0.025 % cream; SMARTSIG:1 Application Topical Every Evening   Princess Bruins MD, 4:19 PM 12/06/2021

## 2022-02-15 ENCOUNTER — Other Ambulatory Visit: Payer: Self-pay | Admitting: Obstetrics & Gynecology

## 2022-02-15 DIAGNOSIS — Z1231 Encounter for screening mammogram for malignant neoplasm of breast: Secondary | ICD-10-CM

## 2022-03-09 ENCOUNTER — Ambulatory Visit
Admission: RE | Admit: 2022-03-09 | Discharge: 2022-03-09 | Disposition: A | Discharge status: Home / Self Care | Payer: Managed Care, Other (non HMO) | Source: Ambulatory Visit | Attending: Obstetrics & Gynecology | Admitting: Obstetrics & Gynecology

## 2022-03-09 DIAGNOSIS — Z1231 Encounter for screening mammogram for malignant neoplasm of breast: Secondary | ICD-10-CM

## 2022-03-10 ENCOUNTER — Other Ambulatory Visit: Payer: Self-pay | Admitting: Obstetrics & Gynecology

## 2022-03-10 DIAGNOSIS — R928 Other abnormal and inconclusive findings on diagnostic imaging of breast: Secondary | ICD-10-CM

## 2022-03-20 ENCOUNTER — Ambulatory Visit
Admission: RE | Admit: 2022-03-20 | Discharge: 2022-03-20 | Disposition: A | Discharge status: Home / Self Care | Payer: Managed Care, Other (non HMO) | Source: Ambulatory Visit | Attending: Obstetrics & Gynecology | Admitting: Obstetrics & Gynecology

## 2022-03-20 DIAGNOSIS — R928 Other abnormal and inconclusive findings on diagnostic imaging of breast: Secondary | ICD-10-CM

## 2022-05-04 HISTORY — PX: COLONOSCOPY: SHX174

## 2022-07-26 ENCOUNTER — Ambulatory Visit: Payer: Managed Care, Other (non HMO) | Admitting: Obstetrics & Gynecology

## 2022-08-08 ENCOUNTER — Encounter: Payer: Self-pay | Admitting: Obstetrics & Gynecology

## 2022-08-08 ENCOUNTER — Ambulatory Visit (INDEPENDENT_AMBULATORY_CARE_PROVIDER_SITE_OTHER): Payer: Managed Care, Other (non HMO) | Admitting: Obstetrics & Gynecology

## 2022-08-08 VITALS — BP 110/64 | HR 68 | Resp 16 | Ht 63.75 in | Wt 170.0 lb

## 2022-08-08 DIAGNOSIS — Z01419 Encounter for gynecological examination (general) (routine) without abnormal findings: Secondary | ICD-10-CM | POA: Diagnosis not present

## 2022-08-08 DIAGNOSIS — N951 Menopausal and female climacteric states: Secondary | ICD-10-CM | POA: Diagnosis not present

## 2022-08-08 DIAGNOSIS — Z9189 Other specified personal risk factors, not elsewhere classified: Secondary | ICD-10-CM

## 2022-08-08 NOTE — Progress Notes (Signed)
Jodi Short [birth year] [id number]   History:    51 y.o. G3P2A1L2 Married.  Vasectomy   RP:  Established patient presenting for annual gyn exam    HPI: Perimenopause, with menses mostly every month, occasionally at 2 months.  Heavy flow x 2-3 days, will try Ibuprofen regularly on those days.  Occasional hot flushes.  PMS with tender breasts and H/As.  No pelvic pain.  No pain with IC.  No h/o abnormal Pap.  Last Pap Neg 07/2021.  Will repeat Pap at 2 years.  Breasts normal.  Screen Mammography 03/2022, Lt Neg, Rt Dx mammo/US Benign.  Urine/BMs wnl.  [place]ono 05/2022, every 5 years.   BMI 29.41.  Health labs with family physician.   Past medical history,surgical history, family history and social history were all reviewed and documented in the EPIC chart.  Gynecologic History Patient's last menstrual period was 07/13/2022 (exact date).  Obstetric History OB History  Gravida Para Term Preterm AB Living  '3 2 2   1 2  '$ SAB IAB Ectopic Multiple Live Births  1       2    # Outcome Date GA Lbr Len/2nd Weight Sex Delivery Anes PTL Lv  3 SAB           2 Term           1 Term              ROS: A ROS was performed and pertinent positives and negatives are included in the history. GENERAL: No fevers or chills. HEENT: No change in vision, no earache, sore throat or sinus congestion. NECK: No pain or stiffness. CARDIOVASCULAR: No chest pain or pressure. No palpitations. PULMONARY: No shortness of breath, cough or wheeze. GASTROINTESTINAL: No abdominal pain, nausea, vomiting or diarrhea, melena or bright red blood per rectum. GENITOURINARY: No urinary frequency, urgency, hesitancy or dysuria. MUSCULOSKELETAL: No joint or muscle pain, no back pain, no recent trauma. DERMATOLOGIC: No rash, no itching, no lesions. ENDOCRINE: No polyuria, polydipsia, no heat or cold intolerance. No recent change in weight. HEMATOLOGICAL: No anemia or easy bruising or bleeding. NEUROLOGIC: No headache, seizures, numbness,  tingling or weakness. PSYCHIATRIC: No depression, no loss of interest in normal activity or change in sleep pattern.     Exam:   BP 110/64   Pulse 68   Resp 16   Ht 5' 3.75" (1.619 m)   Wt 170 lb (77.1 kg)   LMP 07/13/2022 (Exact Date)   BMI 29.41 kg/m   Body mass index is 29.41 kg/m.  General appearance : Well developed well nourished female. No acute distress HEENT: Eyes: no retinal hemorrhage or exudates,  Neck supple, trachea midline, no carotid bruits, no thyroidmegaly Lungs: Clear to auscultation, no rhonchi or wheezes, or rib retractions  Heart: Regular rate and rhythm, no murmurs or gallops Breast:Examined in sitting and supine position were symmetrical in appearance, no palpable masses or tenderness,  no skin retraction, no nipple inversion, no nipple discharge, no skin discoloration, no axillary or supraclavicular lymphadenopathy Abdomen: no palpable masses or tenderness, no rebound or guarding Extremities: no edema or skin discoloration or tenderness  Pelvic: Vulva: Normal             Vagina: No gross lesions or discharge  Cervix: No gross lesions or discharge  Uterus  AV, normal size, shape and consistency, non-tender and mobile  Adnexa  Without masses or tenderness  Anus: Normal   Assessment/Plan:  51 y.o. female for annual  exam   1. Well female exam with routine gynecological exam Perimenopause, with menses mostly every month, occasionally at 2 months.  Heavy flow x 2-3 days, will try Ibuprofen regularly on those days.  Occasional hot flushes.  PMS with tender breasts and H/As.  No pelvic pain.  No pain with IC.  No h/o abnormal Pap.  Last Pap Neg 07/2021.  Will repeat Pap at 2 years.  Breasts normal.  Screen Mammography 03/2022, Lt Neg, Rt Dx mammo/US Benign.  Urine/BMs wnl.  [place] 05/2022, every 5 years.   BMI 29.41.  Health labs with family physician.  2. Relies on partner vasectomy for contraception  3. Perimenopause  Perimenopause, with menses mostly every  month, occasionally at 2 months.  Heavy flow x 2-3 days, will try Ibuprofen regularly on those days.  Occasional hot flushes.  PMS with tender breasts and H/As.  No pelvic pain.  No pain with IC.  Abnormal bleeding precautions discussed.  If has heavier menses or irregular frequent bleeding, will proceed with Pelvic US/EBx.  Princess Bruins MD, 11:16 AM 08/08/2022

## 2022-10-10 ENCOUNTER — Telehealth: Payer: Self-pay | Admitting: *Deleted

## 2022-10-10 DIAGNOSIS — N921 Excessive and frequent menstruation with irregular cycle: Secondary | ICD-10-CM

## 2022-10-10 NOTE — Telephone Encounter (Signed)
Patient called stating she would like to proceed with BCP to help heavy bleeding cycles. Had annual exam on 08/08/22. If Rx approved she does not need to start while on cycle correct? Please advise

## 2022-10-11 MED ORDER — NORETHIN ACE-ETH ESTRAD-FE 1-20 MG-MCG(24) PO TABS: 1.0000 | ORAL_TABLET | Freq: Every day | ORAL | 3 refills | Status: DC

## 2022-10-11 NOTE — Telephone Encounter (Signed)
Dr.Lavoie replied " Yes, agree with [place] 24 1/20, can start at any time given the Vasectomy.  Schedule Pelvic US in 2 months to assess the endometrial line...    Order place, Rx sent. Message sent to appointments to schedule.

## 2022-10-19 NOTE — Telephone Encounter (Signed)
Jodi Short, RMA Two message have been left for patient to call and schedule; patient has not called back. Recall entered and sent for patient to schedule.

## 2022-12-29 ENCOUNTER — Ambulatory Visit (INDEPENDENT_AMBULATORY_CARE_PROVIDER_SITE_OTHER): Payer: 59 | Admitting: Obstetrics & Gynecology

## 2022-12-29 ENCOUNTER — Encounter: Payer: Self-pay | Admitting: Obstetrics & Gynecology

## 2022-12-29 VITALS — BP 104/76 | HR 77

## 2022-12-29 DIAGNOSIS — N921 Excessive and frequent menstruation with irregular cycle: Secondary | ICD-10-CM | POA: Diagnosis not present

## 2022-12-29 NOTE — Progress Notes (Signed)
    Jodi Short [birth year] [id number]        52 y.o.  G3P2A1L2 Married.  Vasectomy.  RP: Heavy menses  HPI: Menses every 1-2 months with heavy flow 5-8 days.  Not improved with Ibuprofen regularly during menses.  Significant dysmenorrhea.  No pelvic pain when not menstruating.  No BTB.  Normal vaginal secretions.   OB History  Gravida Para Term Preterm AB Living  '3 2 2   1 2  '$ SAB IAB Ectopic Multiple Live Births  1       2    # Outcome Date GA Lbr Len/2nd Weight Sex Delivery Anes PTL Lv  3 SAB           2 Term           1 Term             Past medical history,surgical history, problem list, medications, allergies, family history and social history were all reviewed and documented in the EPIC chart.   Directed ROS with pertinent positives and negatives documented in the history of present illness/assessment and plan.  Exam:  Vitals:   12/29/22 1014  BP: 104/76  Pulse: 77  SpO2: 99%   General appearance:  Normal  Abdomen: Normal  Gynecologic exam: Vulva normal.  Bimanual exam:  Uterus AV, normal volume, mobile, NT.  No adnexal mass, NT bilaterally.  No blood, normal secretions.   Assessment/Plan:  52 y.o. [id number]   1. Menorrhagia with irregular cycle Menses every 1-2 months with heavy flow 5-8 days.  Not improved with Ibuprofen regularly during menses.  Significant dysmenorrhea.  No pelvic pain when not menstruating.  No BTB.  Normal vaginal secretions.  Will r/o anemia and hypothyroidism today.  F/U Pelvic US to further investigate.  Continuous BCPs and Progesterone IUD to control the menstrual flow reviewed with patient.  Declines starting on BCPs at this time, worried about BTB. - CBC - TSH  Other orders - Probiotic Product (PROBIOTIC PO); Take by mouth. - lisinopril (ZESTRIL) 10 MG tablet; Take 10 mg by mouth daily.   Princess Bruins MD, 10:28 AM 12/29/2022

## 2022-12-30 LAB — CBC
HCT: 34.8 % — ABNORMAL LOW (ref 35.0–45.0)
Hemoglobin: 12.1 g/dL (ref 11.7–15.5)
MCH: 33.3 pg — ABNORMAL HIGH (ref 27.0–33.0)
MCHC: 34.8 g/dL (ref 32.0–36.0)
MCV: 95.9 fL (ref 80.0–100.0)
MPV: 11.9 fL (ref 7.5–12.5)
Platelets: 205 10*3/uL (ref 140–400)
RBC: 3.63 10*6/uL — ABNORMAL LOW (ref 3.80–5.10)
RDW: 12.4 % (ref 11.0–15.0)
WBC: 5.5 10*3/uL (ref 3.8–10.8)

## 2022-12-30 LAB — TSH: TSH: 2.7 mIU/L

## 2023-01-01 ENCOUNTER — Ambulatory Visit: Payer: Managed Care, Other (non HMO) | Admitting: Obstetrics & Gynecology

## 2023-01-18 ENCOUNTER — Ambulatory Visit (INDEPENDENT_AMBULATORY_CARE_PROVIDER_SITE_OTHER): Payer: 59

## 2023-01-18 ENCOUNTER — Encounter: Payer: Self-pay | Admitting: Obstetrics & Gynecology

## 2023-01-18 ENCOUNTER — Ambulatory Visit (INDEPENDENT_AMBULATORY_CARE_PROVIDER_SITE_OTHER): Payer: 59 | Admitting: Obstetrics & Gynecology

## 2023-01-18 VITALS — BP 108/64 | HR 64

## 2023-01-18 DIAGNOSIS — N921 Excessive and frequent menstruation with irregular cycle: Secondary | ICD-10-CM

## 2023-01-18 DIAGNOSIS — Z9189 Other specified personal risk factors, not elsewhere classified: Secondary | ICD-10-CM

## 2023-01-18 MED ORDER — NORETHIN ACE-ETH ESTRAD-FE 1-20 MG-MCG(24) PO TABS: 1.0000 | ORAL_TABLET | Freq: Every day | ORAL | 4 refills | Status: DC

## 2023-01-18 NOTE — Progress Notes (Signed)
    Jodi Short [birth year] [id number]        51 y.o.  [id number]   RP: Menorrhagia/Dysmenorrhea for Pelvic US  HPI: Menses every 1-2 months with heavy flow 5-8 days. Not improved with Ibuprofen regularly during menses. Significant dysmenorrhea. No pelvic pain when not menstruating. No BTB. Normal vaginal secretions.    OB History  Gravida Para Term Preterm AB Living  3 2 2   1 2  $ SAB IAB Ectopic Multiple Live Births  1       2    # Outcome Date GA Lbr Len/2nd Weight Sex Delivery Anes PTL Lv  3 SAB           2 Term           1 Term             Past medical history,surgical history, problem list, medications, allergies, family history and social history were all reviewed and documented in the EPIC chart.   Directed ROS with pertinent positives and negatives documented in the history of present illness/assessment and plan.  Exam:  Vitals:   01/18/23 0823  BP: 108/64  Pulse: 64   General appearance:  Normal  Pelvic US today: T/V images.  No latex used.  Anteverted uterus normal in size and shape.  The uterus is measured at 10.18 x 7.95 x 5.24 cm.  2 fibroids are seen measuring 2.09 cm and 1.61 cm.  The endometrial lining is thin and symmetrical with no mass or thickening seen.  The endometrium is avascular.  It is measured at 8.28 mm on cycle day #29.  Both ovaries are mobile, normal in size with normal follicular pattern and normal perfusion bilaterally.  No adnexal mass.  No free fluid in the pelvis.  Hb 12.1 on 12/29/22   Assessment/Plan:  52 y.o. G3P2012   1. Menorrhagia with irregular cycle Menses every 1-2 months with heavy flow 5-8 days. Not improved with Ibuprofen regularly during menses. Significant dysmenorrhea. No pelvic pain when not menstruating. No BTB. Normal vaginal secretions.  Pelvic ultrasound findings thoroughly reviewed with Bashan.  Patient reassured that the endometrium is normal.  2 very small fibroids are present which are not likely to be responsible  for her menstrual pattern.  Ovaries normal.  Decision to start on low dosage birth control pill with the generic of Loestrin 24 FE 1/20 to control her menstrual cycle.  No contraindication.  Usage reviewed and prescription sent to pharmacy.  2. Relies on partner vasectomy for contraception  Other orders - lisinopril (ZESTRIL) 2.5 MG tablet; Take 2.5 mg by mouth daily. - Norethindrone Acetate-Ethinyl Estrad-FE (LOESTRIN 24 FE) 1-20 MG-MCG(24) tablet; Take 1 tablet by mouth daily.   Princess Bruins MD, 8:42 AM 01/18/2023

## 2023-01-22 ENCOUNTER — Encounter: Payer: Self-pay | Admitting: Obstetrics & Gynecology

## 2023-02-09 ENCOUNTER — Other Ambulatory Visit: Payer: Self-pay | Admitting: Obstetrics & Gynecology

## 2023-02-09 DIAGNOSIS — Z0000 Encounter for general adult medical examination without abnormal findings: Secondary | ICD-10-CM

## 2023-02-14 ENCOUNTER — Telehealth: Payer: Self-pay

## 2023-02-14 NOTE — Telephone Encounter (Signed)
Jodi Bruins, MD  You1 hour ago (3:19 PM)   Absolutely!  She can do that as much as she wants... Dr Hildred Priest with patient and advised her. She said she would only do this for her trip.

## 2023-02-14 NOTE — Telephone Encounter (Signed)
Patient said Dr. Marguerita Merles instructed her to start BCP on first day of period. She has been spotting for 3 days but not yet a flow. She asked if she should go ahead and start.  Husband with vasectomy.

## 2023-02-14 NOTE — Telephone Encounter (Signed)
Patient leaving for [place] on 3.30.24 and this would put her placebo week/withdrawal bleed while she is there. She would like to avoid bleeding.  Would it be acceptable first pack if she takes 21 active pills and skips placebos and takes another pack of 21 active pills to hold off her period until she returns?

## 2023-03-22 ENCOUNTER — Ambulatory Visit: Payer: Self-pay

## 2023-03-26 ENCOUNTER — Ambulatory Visit
Admission: RE | Admit: 2023-03-26 | Discharge: 2023-03-26 | Disposition: A | Discharge status: Home / Self Care | Payer: 59 | Source: Ambulatory Visit | Attending: Obstetrics & Gynecology | Admitting: Obstetrics & Gynecology

## 2023-03-26 DIAGNOSIS — Z0000 Encounter for general adult medical examination without abnormal findings: Secondary | ICD-10-CM

## 2023-03-29 ENCOUNTER — Other Ambulatory Visit: Payer: Self-pay | Admitting: Obstetrics & Gynecology

## 2023-03-29 DIAGNOSIS — R928 Other abnormal and inconclusive findings on diagnostic imaging of breast: Secondary | ICD-10-CM

## 2023-04-04 ENCOUNTER — Ambulatory Visit
Admission: RE | Admit: 2023-04-04 | Discharge: 2023-04-04 | Disposition: A | Discharge status: Home / Self Care | Payer: 59 | Source: Ambulatory Visit | Attending: Obstetrics & Gynecology | Admitting: Obstetrics & Gynecology

## 2023-04-04 ENCOUNTER — Other Ambulatory Visit: Payer: Self-pay | Admitting: Obstetrics & Gynecology

## 2023-04-04 DIAGNOSIS — R928 Other abnormal and inconclusive findings on diagnostic imaging of breast: Secondary | ICD-10-CM

## 2023-04-04 DIAGNOSIS — R921 Mammographic calcification found on diagnostic imaging of breast: Secondary | ICD-10-CM

## 2023-04-09 ENCOUNTER — Ambulatory Visit
Admission: RE | Admit: 2023-04-09 | Discharge: 2023-04-09 | Disposition: A | Discharge status: Home / Self Care | Payer: 59 | Source: Ambulatory Visit | Attending: Obstetrics & Gynecology | Admitting: Obstetrics & Gynecology

## 2023-04-09 DIAGNOSIS — R928 Other abnormal and inconclusive findings on diagnostic imaging of breast: Secondary | ICD-10-CM

## 2023-04-09 DIAGNOSIS — R921 Mammographic calcification found on diagnostic imaging of breast: Secondary | ICD-10-CM

## 2023-04-09 HISTORY — PX: BREAST BIOPSY: SHX20

## 2023-08-10 ENCOUNTER — Ambulatory Visit: Payer: Managed Care, Other (non HMO) | Admitting: Obstetrics & Gynecology

## 2023-09-05 ENCOUNTER — Ambulatory Visit: Payer: Managed Care, Other (non HMO) | Admitting: Obstetrics and Gynecology

## 2023-11-06 ENCOUNTER — Ambulatory Visit (INDEPENDENT_AMBULATORY_CARE_PROVIDER_SITE_OTHER): Payer: 59 | Admitting: Obstetrics and Gynecology

## 2023-11-06 ENCOUNTER — Encounter: Payer: Self-pay | Admitting: Obstetrics and Gynecology

## 2023-11-06 ENCOUNTER — Other Ambulatory Visit (HOSPITAL_COMMUNITY)
Admission: RE | Admit: 2023-11-06 | Discharge: 2023-11-06 | Disposition: A | Discharge status: Home / Self Care | Payer: 59 | Source: Ambulatory Visit | Attending: Obstetrics and Gynecology | Admitting: Obstetrics and Gynecology

## 2023-11-06 VITALS — BP 110/72 | HR 68 | Resp 16 | Ht 64.0 in | Wt 174.0 lb

## 2023-11-06 DIAGNOSIS — N8003 Adenomyosis of the uterus: Secondary | ICD-10-CM | POA: Diagnosis not present

## 2023-11-06 DIAGNOSIS — Z01419 Encounter for gynecological examination (general) (routine) without abnormal findings: Secondary | ICD-10-CM

## 2023-11-06 DIAGNOSIS — N898 Other specified noninflammatory disorders of vagina: Secondary | ICD-10-CM

## 2023-11-06 DIAGNOSIS — D219 Benign neoplasm of connective and other soft tissue, unspecified: Secondary | ICD-10-CM | POA: Diagnosis not present

## 2023-11-06 DIAGNOSIS — Z0000 Encounter for general adult medical examination without abnormal findings: Secondary | ICD-10-CM | POA: Diagnosis present

## 2023-11-06 DIAGNOSIS — N946 Dysmenorrhea, unspecified: Secondary | ICD-10-CM

## 2023-11-06 DIAGNOSIS — N951 Menopausal and female climacteric states: Secondary | ICD-10-CM

## 2023-11-06 DIAGNOSIS — N921 Excessive and frequent menstruation with irregular cycle: Secondary | ICD-10-CM | POA: Diagnosis not present

## 2023-11-06 NOTE — Progress Notes (Signed)
52 y.o. y.o. G3P2 female here for annual exam.  Reports several years of painful heavy periods.  Using a diaper thick pad and cannot use tampons because it soaks to quickly and she has clots. Periods are painful and always has LLQ pain over the uterus. She has history of migraines and is contraindicated for estrogen use.  She did not want to try the birth control.  She bleeds for 5-6 days a month.  Two to three days of this are extremely heavy bleeding.  Reports starting a period before her trips to [place] and this affects the quality of life and she has to travel on plane or bleed through pads and tampons. Works from home and is glad she does, because of the bleeding and pain. She would like a definitive treatment with the [facility].  No LMP recorded.     Jodi Short [birth year] [id number]   History:    52 y.o. G3P2A1L2 Married.  Vasectomy   RP:  Established patient presenting for annual gyn exam    HPI: Heavy flow x 2-3 days, will try Ibuprofen regularly on those days.  Occasional hot flushes and bloating.  PMS with tender breasts and H/As.     No h/o abnormal Pap.  Last Pap Neg 07/2021.  Will repeat Pap at 2 years.  Breasts normal.  Screen Mammography2025 clip placed.  Urine/BMs wnl.  Colono 05/2022, every 5 years.   BMI 29.41.  Health labs with family physician.   Past medical history,surgical history, family history and social history were all reviewed and documented in the EPIC chart. There is no height or weight on file to calculate BMI.      No data to display          There were no vitals taken for this visit.     Component Value Date/Time   DIAGPAP  07/21/2021 1625    - Negative for intraepithelial lesion or malignancy (NILM)   ADEQPAP  07/21/2021 1625    Satisfactory for evaluation; transformation zone component PRESENT.    GYN HISTORY:    Component Value Date/Time   DIAGPAP  07/21/2021 1625    - Negative for intraepithelial lesion or malignancy (NILM)   ADEQPAP   07/21/2021 1625    Satisfactory for evaluation; transformation zone component PRESENT.    OB History  Gravida Para Term Preterm AB Living  3 2 2   1 2   SAB IAB Ectopic Multiple Live Births  1       2    # Outcome Date GA Lbr Len/2nd Weight Sex Type Anes PTL Lv  3 SAB           2 Term           1 Term             Past Medical History:  Diagnosis Date   Hypertension     Past Surgical History:  Procedure Laterality Date   BREAST BIOPSY Right 04/09/2023   MM RT BREAST BX W LOC DEV 1ST LESION IMAGE BX SPEC STEREO GUIDE 04/09/2023 [facility] MAMMOGRAP[facility]   BREAST EXCISIONAL BIOPSY Left    at age 16   BREAST SURGERY     Breast adenoma   TONSILLECTOMY      Current Outpatient Medications on File Prior to Visit  Medication Sig Dispense Refill   tretinoin (RETIN-A) 0.05 % cream SMARTSIG:sparingly Topical Every Night PRN     triamcinolone cream (KENALOG) 0.1 % Apply topically 2 (two) times  daily as needed.     ibuprofen (ADVIL,MOTRIN) 800 MG tablet Take 1 tablet (800 mg total) by mouth 3 (three) times daily. 21 tablet 0   lisinopril (ZESTRIL) 2.5 MG tablet Take 2.5 mg by mouth daily.     Multiple Vitamin (MULTIVITAMIN) tablet Take 1 tablet by mouth as needed.     Norethindrone Acetate-Ethinyl Estrad-FE (LOESTRIN 24 FE) 1-20 MG-MCG(24) tablet Take 1 tablet by mouth daily. 84 tablet 4   Probiotic Product (PROBIOTIC PO) Take by mouth.     No current facility-administered medications on file prior to visit.    Social History   Socioeconomic History   Marital status: Married    Spouse name: Not on file   Number of children: Not on file   Years of education: Not on file   Highest education level: Not on file  Occupational History   Not on file  Tobacco Use   Smoking status: Never   Smokeless tobacco: Never  Vaping Use   Vaping status: Never Used  Substance and Sexual Activity   Alcohol use: Yes    Comment: occasionally wine   Drug use: No   Sexual activity: Yes    Partners: Male     Birth control/protection: Other-see comments    Comment: VASECTOMY-1st intercourse 51 yo-Fewer than 5 partners  Other Topics Concern   Not on file  Social History Narrative   Not on file   Social Determinants of Health   Financial Resource Strain: Not on file  Food Insecurity: Not on file  Transportation Needs: Not on file  Physical Activity: Not on file  Stress: Not on file  Social Connections: Not on file  Intimate Partner Violence: Not on file    Family History  Problem Relation Age of Onset   Hypertension Mother    Diabetes Father    Melanoma Sister    Hypertension Sister    Breast cancer Maternal Aunt    Breast cancer Paternal Aunt    Diabetes Maternal Grandmother    Heart attack Maternal Grandmother    Diabetes Paternal Grandfather    Diabetes Son        Type 1     Allergies  Allergen Reactions   Sulfa Antibiotics Hives     US Transvaginal Non-OB (Accession [id number]) (Order [id number]) Imaging Date: 01/18/2023 Department: [facility] Imaging Released By: Lillia Carmel, CMA Authorizing: Genia Del, MD   Exam Status  Status  Final [99]   PACS Intelerad Image Link   Show images for US Transvaginal Non-OB Study Result  Narrative & Impression  T/V images. No latex used. Anteverted uterus normal in size and shape. The uterus is measured at 10.18 x 7.95 x 5.24 cm. 2 fibroids are seen measuring 2.09 cm and 1.61 cm. The endometrial lining is thin and symmetrical with no mass or thickening seen. The endometrium is avascular. It is measured at 8.28 mm on cycle day #29. Both ovaries are mobile, normal in size with normal follicular pattern and normal perfusion bilaterally. No adnexal mass. No free fluid in the pelvis.      Patient's last menstrual period was No LMP recorded..            Review of Systems Alls systems reviewed and are negative.     Physical Exam Constitutional:      Appearance: Normal appearance.   Genitourinary:     Vulva and urethral meatus normal.     No lesions in the vagina.  Genitourinary Comments: 10cm fibroid uterus, tender over uterus with palpation and boggy contour     Right Labia: No rash, lesions or skin changes.    Left Labia: No lesions, skin changes or rash.    No vaginal discharge or tenderness.     No vaginal prolapse present.    No vaginal atrophy present.     Right Adnexa: not tender, not palpable and no mass present.    Left Adnexa: not tender, not palpable and no mass present.    No cervical motion tenderness or discharge.     Uterus is enlarged, tender and irregular.  Breasts:    Right: Normal.     Left: Normal.  HENT:     Head: Normocephalic.  Neck:     Thyroid: No thyroid mass, thyromegaly or thyroid tenderness.  Cardiovascular:     Rate and Rhythm: Normal rate and regular rhythm.     Heart sounds: Normal heart sounds, S1 normal and S2 normal.  Pulmonary:     Effort: Pulmonary effort is normal.     Breath sounds: Normal breath sounds and air entry.  Abdominal:     General: There is no distension.     Palpations: Abdomen is soft. There is no mass.     Tenderness: There is no abdominal tenderness. There is no guarding or rebound.  Musculoskeletal:        General: Normal range of motion.     Cervical back: Full passive range of motion without pain, normal range of motion and neck supple. No tenderness.     Right lower leg: No edema.     Left lower leg: No edema.  Neurological:     Mental Status: She is alert.  Skin:    General: Skin is warm.  Psychiatric:        Mood and Affect: Mood normal.        Behavior: Behavior normal.        Thought Content: Thought content normal.  Vitals and nursing note reviewed. Exam conducted with a chaperone present.       A:         Well Woman GYN exam, menorrhagia, fibroids, dysmenorrhea, likely adenomyosis                             P:        Pap smear collected today Encouraged annual mammogram  screening Colon cancer screening up-to-date DXA not indicated Labs and immunizations ordered today Encouraged healthy lifestyle practices RTC for EMB Symptomatic fibroid uterus likely adenomyosis:  Counseled on all options and r/b/a/I of each were reviewed with patient.  She would like to have the [facility].  Counseled extensively on the procedure including but not limited to what to expect and risks and benefits.  Counseled on postop care and pelvic rest for 10 weeks after the surgery with restricted lifting for 6 weeks after.  Counseled on the benefits of the robotic procedure with faster return to daily activities, improved outcomes, and less risk for complications. She would like to have this scheduled.   No follow-ups on file.  Earley Favor

## 2023-11-07 LAB — HEMOGLOBIN A1C
Hgb A1c MFr Bld: 5.1 %{Hb} (ref <5.7)
Mean Plasma Glucose: 100 mg/dL
eAG (mmol/L): 5.5 mmol/L

## 2023-11-07 LAB — VITAMIN D 25 HYDROXY (VIT D DEFICIENCY, FRACTURES): Vit D, 25-Hydroxy: 22 ng/mL — ABNORMAL LOW (ref 30–100)

## 2023-11-07 LAB — CBC
HCT: 41.4 % (ref 35.0–45.0)
Hemoglobin: 13.9 g/dL (ref 11.7–15.5)
MCH: 31.4 pg (ref 27.0–33.0)
MCHC: 33.6 g/dL (ref 32.0–36.0)
MCV: 93.7 fL (ref 80.0–100.0)
MPV: 12.3 fL (ref 7.5–12.5)
Platelets: 188 10*3/uL (ref 140–400)
RBC: 4.42 10*6/uL (ref 3.80–5.10)
RDW: 12.3 % (ref 11.0–15.0)
WBC: 6.1 10*3/uL (ref 3.8–10.8)

## 2023-11-07 LAB — FOLLICLE STIMULATING HORMONE: FSH: 8.6 m[IU]/mL

## 2023-11-07 LAB — SURESWAB® ADVANCED VAGINITIS PLUS,TMA
C. trachomatis RNA, TMA: NOT DETECTED
CANDIDA SPECIES: NOT DETECTED
Candida glabrata: NOT DETECTED
N. gonorrhoeae RNA, TMA: NOT DETECTED
SURESWAB(R) ADV BACTERIAL VAGINOSIS(BV),TMA: NEGATIVE
TRICHOMONAS VAGINALIS (TV),TMA: NOT DETECTED

## 2023-11-07 LAB — ESTRADIOL: Estradiol: 207 pg/mL

## 2023-11-08 LAB — CYTOLOGY - PAP
Comment: NEGATIVE
Diagnosis: NEGATIVE
High risk HPV: NEGATIVE

## 2023-11-09 ENCOUNTER — Encounter: Payer: Self-pay | Admitting: Obstetrics and Gynecology

## 2023-11-09 NOTE — Telephone Encounter (Signed)
Msg routed to [facility] appt desk:  Please contact pt back re: EMB. Order in pt's chart and needs to be scheduled w/ EB at the pt's/our earliest convenience. Thanks.

## 2023-11-12 NOTE — Telephone Encounter (Signed)
Pt scheduled for 11/22/2023-Routing to provider for final review and encounter closed.

## 2023-11-15 ENCOUNTER — Ambulatory Visit: Payer: 59 | Admitting: Podiatry

## 2023-11-20 NOTE — Telephone Encounter (Signed)
Pt LVM in triage line stating that she just started her period and is wondering if can keep appt?

## 2023-11-20 NOTE — Telephone Encounter (Signed)
Per EB:  "Yes, no problem  Dr. Karma Greaser"  Pt advised to keep appt per EB. She voiced understanding and appreciation for the cb. Encounter closed.

## 2023-11-22 ENCOUNTER — Other Ambulatory Visit (HOSPITAL_COMMUNITY)
Admission: RE | Admit: 2023-11-22 | Discharge: 2023-11-22 | Disposition: A | Discharge status: Home / Self Care | Payer: 59 | Source: Ambulatory Visit | Attending: Obstetrics and Gynecology | Admitting: Obstetrics and Gynecology

## 2023-11-22 ENCOUNTER — Ambulatory Visit (INDEPENDENT_AMBULATORY_CARE_PROVIDER_SITE_OTHER): Payer: 59 | Admitting: Obstetrics and Gynecology

## 2023-11-22 ENCOUNTER — Encounter: Payer: Self-pay | Admitting: Obstetrics and Gynecology

## 2023-11-22 VITALS — BP 112/70 | HR 95

## 2023-11-22 DIAGNOSIS — N921 Excessive and frequent menstruation with irregular cycle: Secondary | ICD-10-CM | POA: Insufficient documentation

## 2023-11-22 DIAGNOSIS — Z01812 Encounter for preprocedural laboratory examination: Secondary | ICD-10-CM | POA: Insufficient documentation

## 2023-11-22 DIAGNOSIS — N8003 Adenomyosis of the uterus: Secondary | ICD-10-CM

## 2023-11-22 DIAGNOSIS — D219 Benign neoplasm of connective and other soft tissue, unspecified: Secondary | ICD-10-CM | POA: Insufficient documentation

## 2023-11-22 DIAGNOSIS — N946 Dysmenorrhea, unspecified: Secondary | ICD-10-CM

## 2023-11-22 LAB — PREGNANCY, URINE: Preg Test, Ur: NEGATIVE

## 2023-11-22 NOTE — Progress Notes (Signed)
52 y.o. y.o. G3P2 female here for annual exam.  Reports several years of painful heavy periods.  Using a diaper thick pad and cannot use tampons because it soaks to quickly and she has clots. Periods are painful and always has LLQ pain over the uterus. She has history of migraines and is contraindicated for estrogen use.  She did not want to try the birth control.  She bleeds for 5-6 days a month.  Two to three days of this are extremely heavy bleeding.  Reports starting a period before her trips to [place] and this affects the quality of life and she has to travel on plane or bleed through pads and tampons. Works from home and is glad she does, because of the bleeding and pain. She would like a definitive treatment with the [facility]. She is here today for the EMB.  She is ready to have surgery and reports being on a very painful and heavy period now.  She is frustrated with the pain and bleeding and would like surgery.  Patient's last menstrual period was 10/29/2023.     Jodi Short [birth year] [id number]   History:    52 y.o. G3P2A1L2 Married.  Vasectomy   RP:  Established patient presenting for annual gyn exam    HPI: Heavy flow x 2-3 days, will try Ibuprofen regularly on those days.  Occasional hot flushes and bloating.  PMS with tender breasts and H/As.     No h/o abnormal Pap.  Last Pap Neg 07/2021.  Will repeat Pap at 2 years.  Breasts normal.  Screen Mammography2025 clip placed.  Urine/BMs wnl.  Colono 05/2022, every 5 years.   BMI 29.41.  Health labs with family physician.   Past medical history,surgical history, family history and social history were all reviewed and documented in the EPIC chart. There is no height or weight on file to calculate BMI.     11/06/2023    9:28 AM  Depression screen PHQ 2/9  Decreased Interest 0  Down, Depressed, Hopeless 0  PHQ - 2 Score 0    Last menstrual period 10/29/2023.     Component Value Date/Time   DIAGPAP  11/06/2023 1015    -  Negative for intraepithelial lesion or malignancy (NILM)   DIAGPAP  07/21/2021 1625    - Negative for intraepithelial lesion or malignancy (NILM)   HPVHIGH Negative 11/06/2023 1015   ADEQPAP  11/06/2023 1015    Satisfactory for evaluation; transformation zone component PRESENT.   ADEQPAP  07/21/2021 1625    Satisfactory for evaluation; transformation zone component PRESENT.    GYN HISTORY:    Component Value Date/Time   DIAGPAP  11/06/2023 1015    - Negative for intraepithelial lesion or malignancy (NILM)   DIAGPAP  07/21/2021 1625    - Negative for intraepithelial lesion or malignancy (NILM)   HPVHIGH Negative 11/06/2023 1015   ADEQPAP  11/06/2023 1015    Satisfactory for evaluation; transformation zone component PRESENT.   ADEQPAP  07/21/2021 1625    Satisfactory for evaluation; transformation zone component PRESENT.    OB History  Gravida Para Term Preterm AB Living  3 2 2  1 2   SAB IAB Ectopic Multiple Live Births  1    2    # Outcome Date GA Lbr Len/2nd Weight Sex Type Anes PTL Lv  3 SAB           2 Term           1 Term  Past Medical History:  Diagnosis Date   Cataract    Hypertension     Past Surgical History:  Procedure Laterality Date   BREAST BIOPSY Right 04/09/2023   MM RT BREAST BX W LOC DEV 1ST LESION IMAGE BX SPEC STEREO GUIDE 04/09/2023 [facility]AMMOGRAP[facility]   BREAST EXCISIONAL BIOPSY Left    at age 67   BREAST SURGERY     Breast adenoma   TONSILLECTOMY      Current Outpatient Medications on File Prior to Visit  Medication Sig Dispense Refill   ibuprofen (ADVIL,MOTRIN) 800 MG tablet Take 1 tablet (800 mg total) by mouth 3 (three) times daily. 21 tablet 0   lisinopril (ZESTRIL) 2.5 MG tablet Take 2.5 mg by mouth daily.     Probiotic Product (PROBIOTIC PO) Take by mouth.     tretinoin (RETIN-A) 0.05 % cream SMARTSIG:sparingly Topical Every Night PRN     No current facility-administered medications on file prior to visit.    Social  History   Socioeconomic History   Marital status: Married    Spouse name: Not on file   Number of children: Not on file   Years of education: Not on file   Highest education level: Not on file  Occupational History   Not on file  Tobacco Use   Smoking status: Never   Smokeless tobacco: Never  Vaping Use   Vaping status: Never Used  Substance and Sexual Activity   Alcohol use: Yes    Comment: occasionally wine   Drug use: No   Sexual activity: Yes    Partners: Male    Birth control/protection: Other-see comments    Comment: VASECTOMY-1st intercourse 52 yo-Fewer than 5 partners  Other Topics Concern   Not on file  Social History Narrative   Not on file   Social Drivers of Health   Financial Resource Strain: Not on file  Food Insecurity: Not on file  Transportation Needs: Not on file  Physical Activity: Not on file  Stress: Not on file  Social Connections: Not on file  Intimate Partner Violence: Not on file    Family History  Problem Relation Age of Onset   Hypertension Mother    Diabetes Father    Melanoma Sister    Hypertension Sister    Breast cancer Maternal Aunt    Breast cancer Paternal Aunt    Diabetes Maternal Grandmother    Heart attack Maternal Grandmother    Diabetes Paternal Grandfather    Diabetes Son        Type 1     Allergies  Allergen Reactions   Sulfa Antibiotics Hives     US Transvaginal Non-OB (Accession [id number]) (Order [id number]) Imaging Date: 01/18/2023 Department: [facility] Imaging Released By: Lillia Carmel, CMA Authorizing: Genia Del, MD   Exam Status  Status  Final [99]   PACS Intelerad Image Link   Show images for US Transvaginal Non-OB Study Result  Narrative & Impression  T/V images. No latex used. Anteverted uterus normal in size and shape. The uterus is measured at 10.18 x 7.95 x 5.24 cm. 2 fibroids are seen measuring 2.09 cm and 1.61 cm. The endometrial lining is thin and  symmetrical with no mass or thickening seen. The endometrium is avascular. It is measured at 8.28 mm on cycle day #29. Both ovaries are mobile, normal in size with normal follicular pattern and normal perfusion bilaterally. No adnexal mass. No free fluid in the pelvis.      Patient's  last menstrual period was Patient's last menstrual period was 10/29/2023.Marland Kitchen            Review of Systems Alls systems reviewed and are negative.    from last visit: Physical Exam Constitutional:      Appearance: Normal appearance.  Genitourinary:     Vulva and urethral meatus normal.     No lesions in the vagina.     Genitourinary Comments: 10cm fibroid uterus, tender over uterus with palpation and boggy contour     Right Labia: No rash, lesions or skin changes.    Left Labia: No lesions, skin changes or rash.    No vaginal discharge or tenderness.     No vaginal prolapse present.    No vaginal atrophy present.     Right Adnexa: not tender, not palpable and no mass present.    Left Adnexa: not tender, not palpable and no mass present.    No cervical motion tenderness or discharge.     Uterus is enlarged, tender and irregular.  Breasts:    Right: Normal.     Left: Normal.  HENT:     Head: Normocephalic.  Neck:     Thyroid: No thyroid mass, thyromegaly or thyroid tenderness.  Cardiovascular:     Rate and Rhythm: Normal rate and regular rhythm.     Heart sounds: Normal heart sounds, S1 normal and S2 normal.  Pulmonary:     Effort: Pulmonary effort is normal.     Breath sounds: Normal breath sounds and air entry.  Abdominal:     General: There is no distension.     Palpations: Abdomen is soft. There is no mass.     Tenderness: There is no abdominal tenderness. There is no guarding or rebound.  Musculoskeletal:        General: Normal range of motion.     Cervical back: Full passive range of motion without pain, normal range of motion and neck supple. No tenderness.     Right lower leg: No edema.      Left lower leg: No edema.  Neurological:     Mental Status: She is alert.  Skin:    General: Skin is warm.  Psychiatric:        Mood and Affect: Mood normal.        Behavior: Behavior normal.        Thought Content: Thought content normal.  Vitals and nursing note reviewed. Exam conducted with a chaperone present.     PROCEDURE: EMB Consent obtained for the procedure.  A bivalve speculum was placed in the vagina.  The cervix was grasped with a single tooth tenaculum.  Pipelle was inserted and rotated.  Scantspecimen was obtained and sent to pathology. Patient could not tolerate procedure and procedure was stopped.  All instruments were removed.  Patient tolerated the procedure well.  To notify patient of the results.    A:        EMB, menorrhagia, fibroids, dysmenorrhea, likely adenomyosis                             P:       EMB collected today. Scant material but exam was difficult for patient.  Fibroids distorting cavity as well. Counseled on risk for additional procedure if endometrial cancer was found at the time of the procedure with gynonc. Risk is less than 1%.  Patient voiced understanding.   No follow-ups on file.  Lanora Manis  Tresa Res

## 2023-11-23 ENCOUNTER — Ambulatory Visit (INDEPENDENT_AMBULATORY_CARE_PROVIDER_SITE_OTHER): Payer: 59

## 2023-11-23 ENCOUNTER — Ambulatory Visit: Payer: 59 | Admitting: Podiatry

## 2023-11-23 ENCOUNTER — Encounter: Payer: Self-pay | Admitting: Podiatry

## 2023-11-23 DIAGNOSIS — M722 Plantar fascial fibromatosis: Secondary | ICD-10-CM

## 2023-11-23 DIAGNOSIS — M778 Other enthesopathies, not elsewhere classified: Secondary | ICD-10-CM

## 2023-11-23 MED ORDER — TRIAMCINOLONE ACETONIDE 10 MG/ML IJ SUSP
10.0000 mg | Freq: Once | INTRAMUSCULAR | Status: AC
  Administered 2023-11-23: 10 mg via INTRA_ARTICULAR

## 2023-11-23 MED ORDER — DICLOFENAC SODIUM 75 MG PO TBEC: 75.0000 mg | DELAYED_RELEASE_TABLET | Freq: Two times a day (BID) | ORAL | 2 refills | Status: DC

## 2023-11-23 NOTE — Patient Instructions (Signed)

## 2023-11-27 LAB — SURGICAL PATHOLOGY

## 2023-11-29 NOTE — Progress Notes (Signed)
Subjective:   Patient ID: Jodi Short, female   DOB: 52 y.o.   MRN: [id number]   HPI Patient presents with significant discomfort in the heel left with fluid buildup painful when pressed hard to wear shoe gear without any degree of comfort.  Patient has good digital perfusion well-oriented   Review of Systems  All other systems reviewed and are negative.       Objective:  Physical Exam Vitals and nursing note reviewed.  Constitutional:      Appearance: She is well-developed.  Pulmonary:     Effort: Pulmonary effort is normal.  Musculoskeletal:        General: Normal range of motion.  Skin:    General: Skin is warm.  Neurological:     Mental Status: She is alert.     Neurovascular status intact muscle strength adequate range of motion adequate exquisite discomfort medial fascial band left at the insertional point tendon calcaneus fluid buildup around the medial tendon good digital perfusion well-oriented x 3     Assessment:  Acute plantar fasciitis left inflammation fluid around the medial band     Plan:  H&P reviewed recommended conservative treatment and reviewed x-ray.  Sterile prep injected the medial band of the fascia 3 mg Kenalog 5 mg Xylocaine advised on ice therapy and stretch and reappoint to recheck  X-rays indicate small spur no indication stress fracture arthritis

## 2023-12-03 ENCOUNTER — Encounter: Payer: Self-pay | Admitting: Podiatry

## 2023-12-03 ENCOUNTER — Ambulatory Visit (INDEPENDENT_AMBULATORY_CARE_PROVIDER_SITE_OTHER): Payer: 59 | Admitting: Podiatry

## 2023-12-03 DIAGNOSIS — M722 Plantar fascial fibromatosis: Secondary | ICD-10-CM

## 2023-12-03 NOTE — Progress Notes (Signed)
Subjective:   Patient ID: Jodi Short, female   DOB: 52 y.o.   MRN: [id number]   HPI Patient states the heel is doing much better with reduced discomfort   ROS      Objective:  Physical Exam  Neurovascular status intact pain in the left heel that has reduced quite nicely with patient doing very well     Assessment:  Plantar fasciitis left improving     Plan:  H&P reviewed and I have recommended continued stretching good shoe gear choices heel left and I am discharging unless symptoms were to come back again and we will have to come up with a different treatment plan

## 2023-12-20 ENCOUNTER — Encounter: Payer: Self-pay | Admitting: *Deleted

## 2023-12-21 ENCOUNTER — Encounter: Payer: Self-pay | Admitting: Obstetrics and Gynecology

## 2023-12-21 ENCOUNTER — Ambulatory Visit (INDEPENDENT_AMBULATORY_CARE_PROVIDER_SITE_OTHER): Payer: 59 | Admitting: Obstetrics and Gynecology

## 2023-12-21 VITALS — BP 112/70 | HR 81 | Ht 64.25 in | Wt 172.0 lb

## 2023-12-21 DIAGNOSIS — N946 Dysmenorrhea, unspecified: Secondary | ICD-10-CM | POA: Diagnosis not present

## 2023-12-21 DIAGNOSIS — D219 Benign neoplasm of connective and other soft tissue, unspecified: Secondary | ICD-10-CM

## 2023-12-21 DIAGNOSIS — N8003 Adenomyosis of the uterus: Secondary | ICD-10-CM

## 2023-12-21 DIAGNOSIS — N921 Excessive and frequent menstruation with irregular cycle: Secondary | ICD-10-CM

## 2023-12-21 DIAGNOSIS — Z01818 Encounter for other preprocedural examination: Secondary | ICD-10-CM | POA: Diagnosis not present

## 2023-12-21 MED ORDER — IBUPROFEN 800 MG PO TABS
800.0000 mg | ORAL_TABLET | Freq: Three times a day (TID) | ORAL | 1 refills | Status: DC | PRN
Start: 2023-12-21 — End: 2024-02-28

## 2023-12-21 MED ORDER — OXYCODONE HCL 5 MG PO TABS: 5.0000 mg | ORAL_TABLET | ORAL | 0 refills | Status: DC | PRN

## 2023-12-21 MED ORDER — METOCLOPRAMIDE HCL 10 MG PO TABS: 10.0000 mg | ORAL_TABLET | Freq: Three times a day (TID) | ORAL | 0 refills | Status: DC | PRN

## 2023-12-21 MED ORDER — IBUPROFEN 800 MG PO TABS: 800.0000 mg | ORAL_TABLET | Freq: Three times a day (TID) | ORAL | 0 refills | Status: DC

## 2023-12-21 NOTE — Progress Notes (Signed)
PREOP H&P for RLH, bilateral salpingectomy, cystoscopy, removal of left vulvar atypical nevus   53 y.o. y.o. G3P2 female here for preop exam.  Reports several years of painful heavy periods.  Using a diaper thick pad and cannot use tampons because it soaks to quickly and she has clots. Periods are painful and always has LLQ pain over the uterus. She has history of migraines and is contraindicated for estrogen use.  She did not want to try the birth control.  She bleeds for 5-6 days a month.  Two to three days of this are extremely heavy bleeding.  Reports starting a period before her trips to [place] and this affects the quality of life and she has to travel on plane or bleed through pads and tampons. Works from home and is glad she does, because of the bleeding and pain. She would like a definitive treatment with the [facility]. She ihas completed the EMB.  She is ready to have surgery and reports being on a very painful and heavy period now.  She is frustrated with the pain and bleeding and would like surgery. She states her last period was horrible and she is confident of her decision with proceeding forward with the [facility].  Patient's last menstrual period was 12/17/2023 (exact date).    wk ago   SURGICAL PATHOLOGY SURGICAL PATHOLOGY CASE: [phone] PATIENT: Jodi Short Surgical Pathology Report     Clinical History: menorrhagia with irregular cycle, dysmenorrhea, fibroids, adenomyosis (cm)     FINAL MICROSCOPIC DIAGNOSIS:  A. ENDOMETRIUM, BIOPSY:      Predominant endocervical mucosa with minute endometrial glands.      Negative for hyperplasia, atypia or malignancy.   GROSS DESCRIPTION:  Received in formalin are tan, hemorrhagic soft tissue fragments that are entirely submitted. Volume: 1.0 x 0.6 x 0.2 cm. (1 B)  (KW, 11/23/2023)  Final Diagnosis performed by Lance Coon, MD.   Electronically signed 11/26/2023 Technical and / or Professional components performed at [facility]. [facility], [address], [place], [place] [address].  Immunohistochemistry Technical component (if applicable) was performed at [facility]. [address], [address], [place], [place] [address].   IMMUNOHISTOCHEMISTRY DISCLAIMER (if applicable): Some of these immunohistochemical stains may have been developed and the performance characteristics determine by [facility]. Some may not have been cleared or approved by the U.S. Food and Drug Administration. The FDA has determined that such clearance or approval is not necessary. This test is used for clinical purposes. It should not be regarded as investigational or for research. This laboratory is certified under the Clinical Laboratory Improvement Amendments of 1988 (CLIA-88) as qualified to perform high complexity clinical laboratory testing.  The controls stained appropriately.   IHC stains are performed on formalin fixed, paraffin embedded tissue using a 3,3"diaminobenzidine (DAB) chromogen and Leica Bond Autostainer System. The staining intensity of the nucleus is score manually and is reported as the percentage of tumor cell nuclei demonstrating specific nuclear staining. The specimens are fixed in 10% Neutral Formalin for at least 6 hours and up to 72hrs. These tests are validated on decalcified tissue. Results should be interpreted with caution given the possibility of false negative results on decalcified specimens. Antibody Clones are as follows ER-clone 90F, PR-clone 16, Ki67- clone MM1. Some of these immunohistochemical stains may have been developed and the performance characteristics determined by [facility].  Resulting Agency [facility]        Jodi Short 02-Jun-1971 [id number]  History:    53 y.o. G3P2A1L2 Married.  Vasectomy  from annual exam   HPI: Heavy flow x 2-3 days, will try Ibuprofen regularly on those days.  Occasional hot flushes and bloating.  PMS  with tender breasts and H/As.     No h/o abnormal Pap.  Last Pap Neg 07/2021.  Will repeat Pap at 2 years.  Breasts normal.  Screen Mammography2025 clip placed.  Urine/BMs wnl.  Colono 05/2022, every 5 years.   BMI 29.41.  Health labs with family physician.   Past medical history,surgical history, family history and social history were all reviewed and documented in the EPIC chart. Body mass index is 29.29 kg/m.     11/06/2023    9:28 AM  Depression screen PHQ 2/9  Decreased Interest 0  Down, Depressed, Hopeless 0  PHQ - 2 Score 0    Blood pressure 112/70, pulse 81, height 5' 4.25" (1.632 m), weight 172 lb (78 kg), last menstrual period 12/17/2023, SpO2 99%.     Component Value Date/Time   DIAGPAP  11/06/2023 1015    - Negative for intraepithelial lesion or malignancy (NILM)   DIAGPAP  07/21/2021 1625    - Negative for intraepithelial lesion or malignancy (NILM)   HPVHIGH Negative 11/06/2023 1015   ADEQPAP  11/06/2023 1015    Satisfactory for evaluation; transformation zone component PRESENT.   ADEQPAP  07/21/2021 1625    Satisfactory for evaluation; transformation zone component PRESENT.    GYN HISTORY:    Component Value Date/Time   DIAGPAP  11/06/2023 1015    - Negative for intraepithelial lesion or malignancy (NILM)   DIAGPAP  07/21/2021 1625    - Negative for intraepithelial lesion or malignancy (NILM)   HPVHIGH Negative 11/06/2023 1015   ADEQPAP  11/06/2023 1015    Satisfactory for evaluation; transformation zone component PRESENT.   ADEQPAP  07/21/2021 1625    Satisfactory for evaluation; transformation zone component PRESENT.    OB History  Gravida Para Term Preterm AB Living  3 2 2  1 2   SAB IAB Ectopic Multiple Live Births  1    2    # Outcome Date GA Lbr Len/2nd Weight Sex Type Anes PTL Lv  3 SAB           2 Term           1 Term             Past Medical History:  Diagnosis Date   Cataract    Hypertension    Plantar fasciitis     Past Surgical  History:  Procedure Laterality Date   BREAST BIOPSY Right 04/09/2023   MM RT BREAST BX W LOC DEV 1ST LESION IMAGE BX SPEC STEREO GUIDE 04/09/2023 [facility]   BREAST EXCISIONAL BIOPSY Left    at age 42   BREAST SURGERY     Breast adenoma   TONSILLECTOMY      Current Outpatient Medications on File Prior to Visit  Medication Sig Dispense Refill   Cholecalciferol (VITAMIN D3 PO) Take by mouth.     diclofenac (VOLTAREN) 75 MG EC tablet Take 1 tablet (75 mg total) by mouth 2 (two) times daily. 50 tablet 2   ibuprofen (ADVIL,MOTRIN) 800 MG tablet Take 1 tablet (800 mg total) by mouth 3 (three) times daily. 21 tablet 0   lisinopril (ZESTRIL) 2.5 MG tablet Take 2.5 mg by mouth daily.     Probiotic Product (PROBIOTIC PO) Take by mouth.  tretinoin (RETIN-A) 0.05 % cream SMARTSIG:sparingly Topical Every Night PRN     UNABLE TO FIND Med Name: otc sleep aid     No current facility-administered medications on file prior to visit.    Social History   Socioeconomic History   Marital status: Married    Spouse name: Not on file   Number of children: Not on file   Years of education: Not on file   Highest education level: Not on file  Occupational History   Not on file  Tobacco Use   Smoking status: Former    Types: Cigarettes   Smokeless tobacco: Never  Vaping Use   Vaping status: Never Used  Substance and Sexual Activity   Alcohol use: Yes    Comment: occasionally wine   Drug use: No   Sexual activity: Yes    Partners: Male    Birth control/protection: Other-see comments    Comment: VASECTOMY-1st intercourse 53 yo-Fewer than 5 partners  Other Topics Concern   Not on file  Social History Narrative   Not on file   Social Drivers of Health   Financial Resource Strain: Not on file  Food Insecurity: Not on file  Transportation Needs: Not on file  Physical Activity: Not on file  Stress: Not on file  Social Connections: Not on file  Intimate Partner Violence: Not on file     Family History  Problem Relation Age of Onset   Hypertension Mother    Diabetes Father    Melanoma Sister    Hypertension Sister    Breast cancer Maternal Aunt    Breast cancer Paternal Aunt    Diabetes Maternal Grandmother    Heart attack Maternal Grandmother    Diabetes Paternal Grandfather    Diabetes Son        Type 1     Allergies  Allergen Reactions   Sulfa Antibiotics Hives     US Transvaginal Non-OB (Accession [id number]) (Order [id number]) Imaging Date: 01/18/2023 Department: [facility] Released By: Lillia Carmel, CMA Authorizing: Genia Del, MD   Exam Status  Status  Final [99]   PACS Intelerad Image Link   Show images for US Transvaginal Non-OB Study Result  Narrative & Impression  T/V images. No latex used. Anteverted uterus normal in size and shape. The uterus is measured at 10.18 x 7.95 x 5.24 cm. 2 fibroids are seen measuring 2.09 cm and 1.61 cm. The endometrial lining is thin and symmetrical with no mass or thickening seen. The endometrium is avascular. It is measured at 8.28 mm on cycle day #29. Both ovaries are mobile, normal in size with normal follicular pattern and normal perfusion bilaterally. No adnexal mass. No free fluid in the pelvis.      Patient's last menstrual period was Patient's last menstrual period was 12/17/2023 (exact date)..            Review of Systems Alls systems reviewed and are negative.    from last visit: Physical Exam Constitutional:      Appearance: Normal appearance.  Genitourinary:     Vulva and urethral meatus normal.     No lesions in the vagina.     Genitourinary Comments: 10cm fibroid uterus, tender over uterus with palpation and boggy contour     Right Labia: No rash, lesions or skin changes.    Left Labia: No lesions, skin changes or rash.    No vaginal discharge or tenderness.     No vaginal prolapse present.  No vaginal atrophy present.     Right Adnexa: not  tender, not palpable and no mass present.    Left Adnexa: not tender, not palpable and no mass present.    No cervical motion tenderness or discharge.     Uterus is enlarged, tender and irregular.  Breasts:    Right: Normal.     Left: Normal.  HENT:     Head: Normocephalic.  Neck:     Thyroid: No thyroid mass, thyromegaly or thyroid tenderness.  Cardiovascular:     Rate and Rhythm: Normal rate and regular rhythm.     Heart sounds: Normal heart sounds, S1 normal and S2 normal.  Pulmonary:     Effort: Pulmonary effort is normal.     Breath sounds: Normal breath sounds and air entry.  Abdominal:     General: There is no distension.     Palpations: Abdomen is soft. There is no mass.     Tenderness: There is no abdominal tenderness. There is no guarding or rebound.  Musculoskeletal:        General: Normal range of motion.     Cervical back: Full passive range of motion without pain, normal range of motion and neck supple. No tenderness.     Right lower leg: No edema.     Left lower leg: No edema.  Neurological:     Mental Status: She is alert.  Skin:    General: Skin is warm.  Psychiatric:        Mood and Affect: Mood normal.        Behavior: Behavior normal.        Thought Content: Thought content normal.  Vitals and nursing note reviewed. Exam conducted with a chaperone present.    uthorizing: Genia Del, MD   Exam Status  Status  Final [99]   PACS Intelerad Image Link   Show images for US Transvaginal Non-OB Study Result  Narrative & Impression  T/V images. No latex used. Anteverted uterus normal in size and shape. The uterus is measured at 10.18 x 7.95 x 5.24 cm. 2 fibroids are seen measuring 2.09 cm and 1.61 cm. The endometrial lining is thin and symmetrical with no mass or thickening seen. The endometrium is avascular. It is measured at 8.28 mm on cycle day #29. Both ovaries are mobile, normal in size with normal follicular pattern and normal perfusion  bilaterally. No adnexal mass. No free fluid in the pelvis.      A:        Preop for RLH, bilateral salpingectomy, cystoscopy, removal of left atypical vulvar mole menorrhagia, fibroids, dysmenorrhea, likely adenomyosis                             P:       Counseled on all options.  She would like to have the [facility].  Counseled extensively on the procedure including but not limited to what to expect and risks and benefits.  Counseled on postop care and pelvic rest for 10 weeks after the surgery with restricted lifting for 6 weeks after.  Counseled on the benefits of the robotic procedure with faster return to daily activities, improved outcomes, and less risk for complications. She would like to have this scheduled.  The risks of surgery were discussed in detail with the patient including but not limited to: bleeding which may require transfusion or reoperation; infection which may require prolonged hospitalization or re-hospitalization and antibiotic therapy;  injury to bowel, bladder, ureters and major vessels or other surrounding organs which may lead to other procedures; formation of adhesions; need for additional procedures including laparotomy or subsequent procedures secondary to intraoperative injury or abnormal pathology; thromboembolic phenomenon; incisional problems and other postoperative or anesthesia complications.  The postoperative expectations were also discussed in detail. The patient also understands the alternative treatment options which were discussed in full. All questions were answered.  Patient would like to proceed with the procedure.  Dr. Karma Greaser   No follow-ups on file.  Earley Favor

## 2023-12-21 NOTE — H&P (View-Only) (Signed)
 PREOP H&P for RLH, bilateral salpingectomy, cystoscopy, removal of left vulvar atypical nevus   53 y.o. y.o. G3P2 female here for preop exam.  Reports several years of painful heavy periods.  Using a diaper thick pad and cannot use tampons because it soaks to quickly and she has clots. Periods are painful and always has LLQ pain over the uterus. She has history of migraines and is contraindicated for estrogen use.  She did not want to try the birth control.  She bleeds for 5-6 days a month.  Two to three days of this are extremely heavy bleeding.  Reports starting a period before her trips to [place] and this affects the quality of life and she has to travel on plane or bleed through pads and tampons. Works from home and is glad she does, because of the bleeding and pain. She would like a definitive treatment with the [facility]. She ihas completed the EMB.  She is ready to have surgery and reports being on a very painful and heavy period now.  She is frustrated with the pain and bleeding and would like surgery. She states her last period was horrible and she is confident of her decision with proceeding forward with the [facility].  Patient's last menstrual period was 12/17/2023 (exact date).    wk ago   SURGICAL PATHOLOGY SURGICAL PATHOLOGY CASE: [phone] PATIENT: Jodi Short Surgical Pathology Report     Clinical History: menorrhagia with irregular cycle, dysmenorrhea, fibroids, adenomyosis (cm)     FINAL MICROSCOPIC DIAGNOSIS:  A. ENDOMETRIUM, BIOPSY:      Predominant endocervical mucosa with minute endometrial glands.      Negative for hyperplasia, atypia or malignancy.   GROSS DESCRIPTION:  Received in formalin are tan, hemorrhagic soft tissue fragments that are entirely submitted. Volume: 1.0 x 0.6 x 0.2 cm. (1 B)  (KW, 11/23/2023)  Final Diagnosis performed by Lance Coon, MD.   Electronically signed 11/26/2023 Technical and / or Professional components performed at [facility]. [facility], [address], [place], [place] [address].  Immunohistochemistry Technical component (if applicable) was performed at [facility]. [address], [address], [place], [place] [address].   IMMUNOHISTOCHEMISTRY DISCLAIMER (if applicable): Some of these immunohistochemical stains may have been developed and the performance characteristics determine by [facility]. Some may not have been cleared or approved by the U.S. Food and Drug Administration. The FDA has determined that such clearance or approval is not necessary. This test is used for clinical purposes. It should not be regarded as investigational or for research. This laboratory is certified under the Clinical Laboratory Improvement Amendments of 1988 (CLIA-88) as qualified to perform high complexity clinical laboratory testing.  The controls stained appropriately.   IHC stains are performed on formalin fixed, paraffin embedded tissue using a 3,3"diaminobenzidine (DAB) chromogen and Leica Bond Autostainer System. The staining intensity of the nucleus is score manually and is reported as the percentage of tumor cell nuclei demonstrating specific nuclear staining. The specimens are fixed in 10% Neutral Formalin for at least 6 hours and up to 72hrs. These tests are validated on decalcified tissue. Results should be interpreted with caution given the possibility of false negative results on decalcified specimens. Antibody Clones are as follows ER-clone 90F, PR-clone 16, Ki67- clone MM1. Some of these immunohistochemical stains may have been developed and the performance characteristics determined by [facility].  Resulting Agency [facility]        Jodi Short 02-Jun-1971 [id number]  History:    53 y.o. G3P2A1L2 Married.  Vasectomy  from annual exam   HPI: Heavy flow x 2-3 days, will try Ibuprofen regularly on those days.  Occasional hot flushes and bloating.  PMS  with tender breasts and H/As.     No h/o abnormal Pap.  Last Pap Neg 07/2021.  Will repeat Pap at 2 years.  Breasts normal.  Screen Mammography2025 clip placed.  Urine/BMs wnl.  Colono 05/2022, every 5 years.   BMI 29.41.  Health labs with family physician.   Past medical history,surgical history, family history and social history were all reviewed and documented in the EPIC chart. Body mass index is 29.29 kg/m.     11/06/2023    9:28 AM  Depression screen PHQ 2/9  Decreased Interest 0  Down, Depressed, Hopeless 0  PHQ - 2 Score 0    Blood pressure 112/70, pulse 81, height 5' 4.25" (1.632 m), weight 172 lb (78 kg), last menstrual period 12/17/2023, SpO2 99%.     Component Value Date/Time   DIAGPAP  11/06/2023 1015    - Negative for intraepithelial lesion or malignancy (NILM)   DIAGPAP  07/21/2021 1625    - Negative for intraepithelial lesion or malignancy (NILM)   HPVHIGH Negative 11/06/2023 1015   ADEQPAP  11/06/2023 1015    Satisfactory for evaluation; transformation zone component PRESENT.   ADEQPAP  07/21/2021 1625    Satisfactory for evaluation; transformation zone component PRESENT.    GYN HISTORY:    Component Value Date/Time   DIAGPAP  11/06/2023 1015    - Negative for intraepithelial lesion or malignancy (NILM)   DIAGPAP  07/21/2021 1625    - Negative for intraepithelial lesion or malignancy (NILM)   HPVHIGH Negative 11/06/2023 1015   ADEQPAP  11/06/2023 1015    Satisfactory for evaluation; transformation zone component PRESENT.   ADEQPAP  07/21/2021 1625    Satisfactory for evaluation; transformation zone component PRESENT.    OB History  Gravida Para Term Preterm AB Living  3 2 2  1 2   SAB IAB Ectopic Multiple Live Births  1    2    # Outcome Date GA Lbr Len/2nd Weight Sex Type Anes PTL Lv  3 SAB           2 Term           1 Term             Past Medical History:  Diagnosis Date   Cataract    Hypertension    Plantar fasciitis     Past Surgical  History:  Procedure Laterality Date   BREAST BIOPSY Right 04/09/2023   MM RT BREAST BX W LOC DEV 1ST LESION IMAGE BX SPEC STEREO GUIDE 04/09/2023 [facility]   BREAST EXCISIONAL BIOPSY Left    at age 42   BREAST SURGERY     Breast adenoma   TONSILLECTOMY      Current Outpatient Medications on File Prior to Visit  Medication Sig Dispense Refill   Cholecalciferol (VITAMIN D3 PO) Take by mouth.     diclofenac (VOLTAREN) 75 MG EC tablet Take 1 tablet (75 mg total) by mouth 2 (two) times daily. 50 tablet 2   ibuprofen (ADVIL,MOTRIN) 800 MG tablet Take 1 tablet (800 mg total) by mouth 3 (three) times daily. 21 tablet 0   lisinopril (ZESTRIL) 2.5 MG tablet Take 2.5 mg by mouth daily.     Probiotic Product (PROBIOTIC PO) Take by mouth.  tretinoin (RETIN-A) 0.05 % cream SMARTSIG:sparingly Topical Every Night PRN     UNABLE TO FIND Med Name: otc sleep aid     No current facility-administered medications on file prior to visit.    Social History   Socioeconomic History   Marital status: Married    Spouse name: Not on file   Number of children: Not on file   Years of education: Not on file   Highest education level: Not on file  Occupational History   Not on file  Tobacco Use   Smoking status: Former    Types: Cigarettes   Smokeless tobacco: Never  Vaping Use   Vaping status: Never Used  Substance and Sexual Activity   Alcohol use: Yes    Comment: occasionally wine   Drug use: No   Sexual activity: Yes    Partners: Male    Birth control/protection: Other-see comments    Comment: VASECTOMY-1st intercourse 53 yo-Fewer than 5 partners  Other Topics Concern   Not on file  Social History Narrative   Not on file   Social Drivers of Health   Financial Resource Strain: Not on file  Food Insecurity: Not on file  Transportation Needs: Not on file  Physical Activity: Not on file  Stress: Not on file  Social Connections: Not on file  Intimate Partner Violence: Not on file     Family History  Problem Relation Age of Onset   Hypertension Mother    Diabetes Father    Melanoma Sister    Hypertension Sister    Breast cancer Maternal Aunt    Breast cancer Paternal Aunt    Diabetes Maternal Grandmother    Heart attack Maternal Grandmother    Diabetes Paternal Grandfather    Diabetes Son        Type 1     Allergies  Allergen Reactions   Sulfa Antibiotics Hives     US Transvaginal Non-OB (Accession [id number]) (Order [id number]) Imaging Date: 01/18/2023 Department: [facility] Released By: Lillia Carmel, CMA Authorizing: Genia Del, MD   Exam Status  Status  Final [99]   PACS Intelerad Image Link   Show images for US Transvaginal Non-OB Study Result  Narrative & Impression  T/V images. No latex used. Anteverted uterus normal in size and shape. The uterus is measured at 10.18 x 7.95 x 5.24 cm. 2 fibroids are seen measuring 2.09 cm and 1.61 cm. The endometrial lining is thin and symmetrical with no mass or thickening seen. The endometrium is avascular. It is measured at 8.28 mm on cycle day #29. Both ovaries are mobile, normal in size with normal follicular pattern and normal perfusion bilaterally. No adnexal mass. No free fluid in the pelvis.      Patient's last menstrual period was Patient's last menstrual period was 12/17/2023 (exact date)..            Review of Systems Alls systems reviewed and are negative.    from last visit: Physical Exam Constitutional:      Appearance: Normal appearance.  Genitourinary:     Vulva and urethral meatus normal.     No lesions in the vagina.     Genitourinary Comments: 10cm fibroid uterus, tender over uterus with palpation and boggy contour     Right Labia: No rash, lesions or skin changes.    Left Labia: No lesions, skin changes or rash.    No vaginal discharge or tenderness.     No vaginal prolapse present.  No vaginal atrophy present.     Right Adnexa: not  tender, not palpable and no mass present.    Left Adnexa: not tender, not palpable and no mass present.    No cervical motion tenderness or discharge.     Uterus is enlarged, tender and irregular.  Breasts:    Right: Normal.     Left: Normal.  HENT:     Head: Normocephalic.  Neck:     Thyroid: No thyroid mass, thyromegaly or thyroid tenderness.  Cardiovascular:     Rate and Rhythm: Normal rate and regular rhythm.     Heart sounds: Normal heart sounds, S1 normal and S2 normal.  Pulmonary:     Effort: Pulmonary effort is normal.     Breath sounds: Normal breath sounds and air entry.  Abdominal:     General: There is no distension.     Palpations: Abdomen is soft. There is no mass.     Tenderness: There is no abdominal tenderness. There is no guarding or rebound.  Musculoskeletal:        General: Normal range of motion.     Cervical back: Full passive range of motion without pain, normal range of motion and neck supple. No tenderness.     Right lower leg: No edema.     Left lower leg: No edema.  Neurological:     Mental Status: She is alert.  Skin:    General: Skin is warm.  Psychiatric:        Mood and Affect: Mood normal.        Behavior: Behavior normal.        Thought Content: Thought content normal.  Vitals and nursing note reviewed. Exam conducted with a chaperone present.    uthorizing: Genia Del, MD   Exam Status  Status  Final [99]   PACS Intelerad Image Link   Show images for US Transvaginal Non-OB Study Result  Narrative & Impression  T/V images. No latex used. Anteverted uterus normal in size and shape. The uterus is measured at 10.18 x 7.95 x 5.24 cm. 2 fibroids are seen measuring 2.09 cm and 1.61 cm. The endometrial lining is thin and symmetrical with no mass or thickening seen. The endometrium is avascular. It is measured at 8.28 mm on cycle day #29. Both ovaries are mobile, normal in size with normal follicular pattern and normal perfusion  bilaterally. No adnexal mass. No free fluid in the pelvis.      A:        Preop for RLH, bilateral salpingectomy, cystoscopy, removal of left atypical vulvar mole menorrhagia, fibroids, dysmenorrhea, likely adenomyosis                             P:       Counseled on all options.  She would like to have the [facility].  Counseled extensively on the procedure including but not limited to what to expect and risks and benefits.  Counseled on postop care and pelvic rest for 10 weeks after the surgery with restricted lifting for 6 weeks after.  Counseled on the benefits of the robotic procedure with faster return to daily activities, improved outcomes, and less risk for complications. She would like to have this scheduled.  The risks of surgery were discussed in detail with the patient including but not limited to: bleeding which may require transfusion or reoperation; infection which may require prolonged hospitalization or re-hospitalization and antibiotic therapy;  injury to bowel, bladder, ureters and major vessels or other surrounding organs which may lead to other procedures; formation of adhesions; need for additional procedures including laparotomy or subsequent procedures secondary to intraoperative injury or abnormal pathology; thromboembolic phenomenon; incisional problems and other postoperative or anesthesia complications.  The postoperative expectations were also discussed in detail. The patient also understands the alternative treatment options which were discussed in full. All questions were answered.  Patient would like to proceed with the procedure.  Dr. Karma Greaser   No follow-ups on file.  Earley Favor

## 2023-12-21 NOTE — Patient Instructions (Signed)
Robotic Laparoscopic Hysterectomy, Care After  The following information offers guidance on how to care for yourself after your procedure. Your health care provider may also give you more specific instructions. If you have problems or questions, contact your health care provider. What can I expect after the procedure? After the procedure, it is common to have: Pain, bruising, and numbness around your incisions. Tiredness (fatigue). Poor appetite. Less interest in sex. Vaginal discharge or bleeding. You will need to use a sanitary pad after this procedure.  HEAVY BLEEDING LIKE A PERIOD IS NOT NORMAL.  PLEASE CALL YOUR PROVIDER IF SOAKING A PAD. Feelings of sadness or other emotions. If your ovaries were also removed, it is also common to have symptoms of menopause, such as hot flashes, night sweats, and lack of sleep (insomnia).  Ovaries should stay in if at all possible until at least the age of 30. Follow these instructions at home: Medicines Take over-the-counter and prescription medicines only as told by your health care provider. Ask your health care provider if the medicine prescribed to you: Requires you to avoid driving or using machinery. Can cause constipation. You may need to take these actions to prevent or treat constipation: Drink enough fluid to keep your urine pale yellow. Take over-the-counter or prescription medicines. Eat foods that are high in fiber, such as beans, whole grains, and fresh fruits and vegetables. Limit foods that are high in fat and processed sugars, such as fried or sweet foods.  Also, avoid spicy foods.  NAUSEA IS COMMON THE FIRST NIGHT OF SURGERY.  IF IT LASTS BEYOND 24 HOURS, CALL YOUR PROVIDER.  NAUSEA MEDICATION WAS GIVEN AT YOUR PREOP APPOINTMENT THAT YOU CAN TAKE AFTER SURGERY. Incision care  Follow instructions from your health care provider about how to take care of your incisions. Make sure you: LEAVE INCISION OPEN AND DRY-NO BANDAGES Leave  stitches (sutures), skin glue, or adhesive strips in place UNTIL 2 WEEKS THEN REMOVE IN THE SHOWER.  If adhesive strip edges start to loosen and curl up, you may trim the loose edges. Check your incision areas every day for signs of infection. Check for: More redness, swelling, or pain. Fluid or blood. Warmth. Pus or a bad smell. Activity  Rest as told by your health care provider. Avoid sitting for a long time without moving. Get up to take short walks every 1-2 hours. This is important to improve blood flow and breathing. Ask for help if you feel weak or unsteady. Return to your normal activities as told by your health care provider. Ask your health care provider what activities are safe for you. Do not lift anything that is heavier than 10 lb (4.5 kg), or the limit that you are told, for 8 WEEKS after surgery or until your health care provider says that it is safe. If you were given a sedative during the procedure, it can affect you for several hours. Do not drive or operate machinery until your health care provider says that it is safe. Lifestyle Do not use any products that contain nicotine or tobacco. These products include cigarettes, chewing tobacco, and vaping devices, such as e-cigarettes. These can delay healing after surgery. If you need help quitting, ask your health care provider. Do not drink alcohol until your health care provider approves. General instructions FOR 2 WEEKS AFTER SURGERY, THEN YOU MAY USE TUBS AND HOT TUBS Do not douche, use tampons, or have sex for at least 10 weeks, or as told by your health care  provider. If you struggle with physical or emotional changes after your procedure, speak with your health care provider or a therapist. Do not take baths, swim, or use a hot tub until your health care provider approves. You may only be allowed to take showers for 2 weeks. IF YOU HAVE BURNING WITH URINATION, PLEASE CALL YOUR DOCTOR. BLADDER INFECTIONS MAY OCCUR AFTER  SURGERY Try to have someone at home with you for the first 1-2 weeks to help with your daily chores. Wear compression stockings as told by your health care provider. These stockings help to prevent blood clots and reduce swelling in your legs. Keep all follow-up visits. This is important. Contact a health care provider if: You have any of these signs of infection: Chills or a fever 125f OR GREATER. More redness, swelling, or pain around an incision. Fluid or blood coming from an incision. Warmth coming from an incision. Pus or a bad smell coming from an incision. Burning with urination. Urinary frequency or cramping.   IF YOU HAVE THESE SYMPTOMS, PLEASE CALL THE OFFICE TO COME EVALUATE FOR A BLADDER INFECTION AT [phone] An incision opens. You feel dizzy or light-headed. You have pain or bleeding when you urinate, or you are unable to urinate. You have abnormal vaginal discharge. You have pain that does not get better with medicine. Get help right away if: You have a fever and your symptoms suddenly get worse. You have severe abdominal pain. You have chest pain or shortness of breath. You may have chest pain and shortness of breath from the CO2 gas for a few days after surgery.  This is very common.  Walking, Gas-X and motrin will usually help relieve this discomfort You faint. You have pain, swelling, or redness in your leg. You have heavy vaginal bleeding with blood clots, soaking through a sanitary pad in less than 1 hour. These symptoms may represent a serious problem that is an emergency. Do not wait to see if the symptoms will go away. Get medical help right away. Call your local emergency services (911 in the [place] Do not drive yourself to the hospital. Summary  CONSTIPATION MEDICATION AFTER SURGERY: COLACE, MOM, MIRALAX, GAS X are all helpful to have on hand, if needed.  FILL ALL POSTOP MEDICATION BEFORE SURGERY  After the procedure, it is common to have pain and bruising  around your incisions. Do not take baths, swim, or use a hot tub until your health care provider approves. Do not lift anything that is heavier than 8- 10 lb (4.5 kg), or the limit that you are told, for one month after surgery or until your health care provider says that it is safe. Tell your health care provider if you have any signs or symptoms of infection after the procedure. Get help right away if you have severe abdominal pain, chest pain, shortness of breath, or heavy bleeding from your vagina. This information is not intended to replace advice given to you by your  health care provider. Make sure you discuss any questions you have with your health care provider. Document Revised: 07/22/2020 Document Reviewed: 07/23/2020 Elsevier Patient Education  2024 [employer].

## 2023-12-28 ENCOUNTER — Encounter (HOSPITAL_BASED_OUTPATIENT_CLINIC_OR_DEPARTMENT_OTHER): Payer: Self-pay | Admitting: Obstetrics and Gynecology

## 2023-12-31 ENCOUNTER — Encounter (HOSPITAL_BASED_OUTPATIENT_CLINIC_OR_DEPARTMENT_OTHER): Payer: Self-pay | Admitting: Obstetrics and Gynecology

## 2024-01-01 ENCOUNTER — Encounter (HOSPITAL_BASED_OUTPATIENT_CLINIC_OR_DEPARTMENT_OTHER): Payer: Self-pay | Admitting: Obstetrics and Gynecology

## 2024-01-01 NOTE — Progress Notes (Signed)
Your procedure is scheduled on :  Friday,  01-04-2024  Report to [facility] AT  _11:00__ AM.   Call this number if you have problems the morning of surgery:  [phone] Any questions prior to surgery call pre-op nurse,  Vaani Morren :  [phone]   OUR ADDRESS IS [address].  WE ARE LOCATED IN [facility].  PLEASE BRING YOUR INSURANCE CARD AND PHOTO ID DAY OF SURGERY.                                     REMEMBER:  Do not eat foot after midnight night before surgery.  You may have clear liquid diet from midnight night before surgery until 10:00 AM.  NO clear liquids after 10:00 AM day of surgery.  This includes no water,  candy,  gum,  and  mints.   Please brush your teeth morning of surgery and rinse mouth out.   CLEAR LIQUID DIET Allowed      Water                                                                   Coffee and tea, regular and decaf  (NO cream or milk products of any type, may sweeten,  no honey)                         Carbonated beverages, regular and diet                                    Sports drinks like Gatorade _____________________________________________________________________     TAKE ONLY THESE MEDICATIONS MORNING OF SURGERY:   None                                        DO NOT WEAR JEWERLY/  METAL/  PIERCINGS (INCLUDING NO PLASTIC PIERCINGS) DO NOT WEAR LOTIONS, POWDERS, PERFUMES OR NAIL POLISH ON YOUR FINGERNAILS. TOENAIL POLISH IS OK TO WEAR. DO NOT SHAVE FOR 48 HOURS PRIOR TO DAY OF SURGERY.  CONTACTS, GLASSES, OR DENTURES MAY NOT BE WORN TO SURGERY.  REMEMBER: NO SMOKING, VAPING ,  DRUGS OR ALCOHOL FOR 24 HOURS BEFORE YOUR SURGERY.                                    [facility] IS NOT RESPONSIBLE  FOR ANY BELONGINGS.                                                                    Marland Kitchen           [place] - Preparing for Surgery Before surgery, you  can play an important role.  Because skin is  not sterile, your skin needs to be as free of germs as possible.  You can reduce the number of germs on your skin by washing with CHG (chlorahexidine gluconate) soap before surgery.  CHG is an antiseptic cleaner which kills germs and bonds with the skin to continue killing germs even after washing. Please DO NOT use if you have an allergy to CHG or antibacterial soaps.  If your skin becomes reddened/irritated stop using the CHG and inform your nurse when you arrive at Short Stay. Do not shave (including legs and underarms) for at least 48 hours prior to the first CHG shower.  You may shave your face/neck. Please follow these instructions carefully:  1.  Shower with CHG Soap the night before surgery and the  morning of Surgery.  2.  If you choose to wash your hair, wash your hair first as usual with your  normal  shampoo.  3.  After you shampoo, rinse your hair and body thoroughly to remove the  shampoo.                                        4.  Use CHG as you would any other liquid soap.  You can apply chg directly  to the skin and wash , chg soap provided, night before and morning of your surgery.  5.  Apply the CHG Soap to your body ONLY FROM THE NECK DOWN.   Do not use on face/ open                           Wound or open sores. Avoid contact with eyes, ears mouth and genitals (private parts).                       Wash face,  Genitals (private parts) with your normal soap.             6.  Wash thoroughly, paying special attention to the area where your surgery  will be performed.  7.  Thoroughly rinse your body with warm water from the neck down.  8.  DO NOT shower/wash with your normal soap after using and rinsing off  the CHG Soap.             9.  Pat yourself dry with a clean towel.            10.  Wear clean pajamas.            11.  Place clean sheets on your bed the night of your first shower and do not  sleep with pets. Day of Surgery : Do not apply any lotions/ powders the morning of  surgery.  Please wear clean clothes to the hospital/surgery center.  IF YOU HAVE ANY SKIN IRRITATION OR PROBLEMS WITH THE SURGICAL SOAP, PLEASE GET A BAR OF GOLD DIAL SOAP AND SHOWER THE NIGHT BEFORE YOUR SURGERY AND THE MORNING OF YOUR SURGERY. PLEASE LET THE NURSE KNOW MORNING OF YOUR SURGERY IF YOU HAD ANY PROBLEMS WITH THE SURGICAL SOAP.   YOUR SURGEON MAY HAVE REQUESTED EXTENDED RECOVERY TIME AFTER YOUR SURGERY. IT COULD BE A  JUST A FEW HOURS  UP TO AN OVERNIGHT STAY.  YOUR SURGEON SHOULD HAVE DISCUSSED THIS WITH YOU PRIOR TO YOUR SURGERY. IN  THE EVENT YOU NEED TO STAY OVERNIGHT PLEASE REFER TO THE FOLLOWING GUIDELINES. YOU MAY HAVE UP TO 4 VISITORS  MAY VISIT IN THE EXTENDED RECOVERY ROOM UNTIL 800 PM ONLY.  ONE  VISITOR AGE 81 AND OVER MAY SPEND THE NIGHT AND MUST BE IN EXTENDED RECOVERY ROOM NO LATER THAN 800 PM . YOUR DISCHARGE TIME AFTER YOU SPEND THE NIGHT IS 900 AM THE MORNING AFTER YOUR SURGERY. YOU MAY PACK A SMALL OVERNIGHT BAG WITH TOILETRIES FOR YOUR OVERNIGHT STAY IF YOU WISH.  REGARDLESS OF IF YOU STAY OVER NIGHT OR ARE DISCHARGED THE SAME DAY YOU WILL BE REQUIRED TO HAVE A RESPONSIBLE ADULT (18 YRS OLD OR OLDER) STAY WITH YOU FOR AT LEAST THE FIRST 24 HOURS WHEN HOME  YOUR PRESCRIPTION MEDICATIONS WILL BE PROVIDED DURING [facility] STAY.  ________________________________________________________________________

## 2024-01-01 NOTE — Telephone Encounter (Signed)
Patient notified of surgery time change, 01/04/24, [facility] at 0730, arrive at 0530.   Patient verbalizes understanding and is agreeable.

## 2024-01-01 NOTE — Progress Notes (Addendum)
Spoke w/ via phone for pre-op interview--- pt Lab needs dos----    urine preg     Lab results------ lab appt 01-02-2024 @ 1415 getting CBC/ BMP/ T&S/ EKG COVID test -----patient states asymptomatic no test needed Arrive at ------- 1100 on 01-04-2024 NPO after MN NO Solid Food.  Clear liquids from MN until--- 1000 Med rec completed Medications to take morning of surgery ----- none Diabetic medication ----- n/a Patient instructed no nail polish to be worn day of surgery Patient instructed to bring photo id and insurance card day of surgery Patient aware to have Driver (ride ) / caregiver    for 24 hours after surgery - husband, shawn Patient Special Instructions ----- will pick up bag w/ hibiclens and written instructions at lab appt. Asked to call w/ any questions Pre-Op special Instructions ----- sent inbox message to dr boswell in epic on , 12-27-2023 and 01-01-2024, requested pre-op orders. Patient verbalized understanding of instructions that were given at this phone interview. Patient denies chest pain, sob, fever, cough at the interview.

## 2024-01-02 ENCOUNTER — Encounter (HOSPITAL_COMMUNITY)
Admission: RE | Admit: 2024-01-02 | Discharge: 2024-01-02 | Disposition: A | Discharge status: Home / Self Care | Payer: 59 | Source: Ambulatory Visit | Attending: Obstetrics and Gynecology | Admitting: Obstetrics and Gynecology

## 2024-01-02 DIAGNOSIS — Z01810 Encounter for preprocedural cardiovascular examination: Secondary | ICD-10-CM | POA: Diagnosis present

## 2024-01-02 DIAGNOSIS — Z01818 Encounter for other preprocedural examination: Secondary | ICD-10-CM | POA: Diagnosis not present

## 2024-01-02 DIAGNOSIS — Z01812 Encounter for preprocedural laboratory examination: Secondary | ICD-10-CM | POA: Diagnosis present

## 2024-01-02 LAB — BASIC METABOLIC PANEL
Anion gap: 8 (ref 5–15)
BUN: 13 mg/dL (ref 6–20)
CO2: 23 mmol/L (ref 22–32)
Calcium: 9.2 mg/dL (ref 8.9–10.3)
Chloride: 106 mmol/L (ref 98–111)
Creatinine, Ser: 0.93 mg/dL (ref 0.44–1.00)
GFR, Estimated: >60 mL/min (ref >60)
Glucose, Bld: 100 mg/dL — ABNORMAL HIGH (ref 70–99)
Potassium: 4.2 mmol/L (ref 3.5–5.1)
Sodium: 137 mmol/L (ref 135–145)

## 2024-01-02 LAB — CBC
HCT: 40.2 % (ref 36.0–46.0)
Hemoglobin: 13.6 g/dL (ref 12.0–15.0)
MCH: 32.4 pg (ref 26.0–34.0)
MCHC: 33.8 g/dL (ref 30.0–36.0)
MCV: 95.7 fL (ref 80.0–100.0)
Platelets: 234 10*3/uL (ref 150–400)
RBC: 4.2 MIL/uL (ref 3.87–5.11)
RDW: 13 % (ref 11.5–15.5)
WBC: 5.6 10*3/uL (ref 4.0–10.5)
nRBC: 0 % (ref 0.0–0.2)

## 2024-01-03 NOTE — Anesthesia Preprocedure Evaluation (Addendum)
Anesthesia Evaluation  Patient identified by MRN, date of birth, ID band Patient awake    Reviewed: Allergy & Precautions, NPO status , Patient's Chart, lab work & pertinent test results  History of Anesthesia Complications Negative for: history of anesthetic complications  Airway Mallampati: II  TM Distance: >3 FB Neck ROM: Full    Dental no notable dental hx.    Pulmonary former smoker   Pulmonary exam normal        Cardiovascular hypertension, Pt. on medications Normal cardiovascular exam     Neuro/Psych negative neurological ROS  negative psych ROS   GI/Hepatic negative GI ROS, Neg liver ROS,,,  Endo/Other  negative endocrine ROS    Renal/GU negative Renal ROS  negative genitourinary   Musculoskeletal negative musculoskeletal ROS (+)    Abdominal   Peds  Hematology negative hematology ROS (+) Hb 13.6, plt 234   Anesthesia Other Findings   Reproductive/Obstetrics Menorrhagia, dysmenorrhea                              Anesthesia Physical Anesthesia Plan  ASA: 2  Anesthesia Plan: General   Post-op Pain Management: Tylenol PO (pre-op)*, Toradol IV (intra-op)* and Ketamine IV*   Induction: Intravenous  PONV Risk Score and Plan: 4 or greater and Ondansetron, Dexamethasone, Midazolam, Treatment may vary due to age or medical condition and Scopolamine patch - Pre-op  Airway Management Planned: Oral ETT  Additional Equipment: None  Intra-op Plan:   Post-operative Plan: Extubation in OR  Informed Consent: I have reviewed the patients History and Physical, chart, labs and discussed the procedure including the risks, benefits and alternatives for the proposed anesthesia with the patient or authorized representative who has indicated his/her understanding and acceptance.     Dental advisory given  Plan Discussed with: CRNA  Anesthesia Plan Comments:         Anesthesia  Quick Evaluation

## 2024-01-04 ENCOUNTER — Ambulatory Visit (HOSPITAL_BASED_OUTPATIENT_CLINIC_OR_DEPARTMENT_OTHER): Payer: 59 | Admitting: Anesthesiology

## 2024-01-04 ENCOUNTER — Other Ambulatory Visit: Payer: Self-pay

## 2024-01-04 ENCOUNTER — Encounter (HOSPITAL_BASED_OUTPATIENT_CLINIC_OR_DEPARTMENT_OTHER)
Admission: RE | Disposition: A | Discharge status: Home / Self Care | Payer: Self-pay | Source: Home / Self Care | Attending: Obstetrics and Gynecology

## 2024-01-04 ENCOUNTER — Encounter (HOSPITAL_BASED_OUTPATIENT_CLINIC_OR_DEPARTMENT_OTHER): Payer: Self-pay | Admitting: Obstetrics and Gynecology

## 2024-01-04 ENCOUNTER — Ambulatory Visit (HOSPITAL_BASED_OUTPATIENT_CLINIC_OR_DEPARTMENT_OTHER)
Admission: RE | Admit: 2024-01-04 | Discharge: 2024-01-04 | Disposition: A | Discharge status: Home / Self Care | Payer: 59 | Attending: Obstetrics and Gynecology | Admitting: Obstetrics and Gynecology

## 2024-01-04 DIAGNOSIS — N888 Other specified noninflammatory disorders of cervix uteri: Secondary | ICD-10-CM | POA: Insufficient documentation

## 2024-01-04 DIAGNOSIS — N946 Dysmenorrhea, unspecified: Secondary | ICD-10-CM | POA: Diagnosis present

## 2024-01-04 DIAGNOSIS — D250 Submucous leiomyoma of uterus: Secondary | ICD-10-CM | POA: Diagnosis not present

## 2024-01-04 DIAGNOSIS — Z8249 Family history of ischemic heart disease and other diseases of the circulatory system: Secondary | ICD-10-CM | POA: Diagnosis not present

## 2024-01-04 DIAGNOSIS — Z87891 Personal history of nicotine dependence: Secondary | ICD-10-CM | POA: Insufficient documentation

## 2024-01-04 DIAGNOSIS — I10 Essential (primary) hypertension: Secondary | ICD-10-CM | POA: Insufficient documentation

## 2024-01-04 DIAGNOSIS — N921 Excessive and frequent menstruation with irregular cycle: Secondary | ICD-10-CM | POA: Diagnosis present

## 2024-01-04 DIAGNOSIS — D259 Leiomyoma of uterus, unspecified: Secondary | ICD-10-CM | POA: Diagnosis not present

## 2024-01-04 DIAGNOSIS — D252 Subserosal leiomyoma of uterus: Secondary | ICD-10-CM | POA: Diagnosis not present

## 2024-01-04 DIAGNOSIS — D251 Intramural leiomyoma of uterus: Secondary | ICD-10-CM | POA: Diagnosis not present

## 2024-01-04 DIAGNOSIS — N838 Other noninflammatory disorders of ovary, fallopian tube and broad ligament: Secondary | ICD-10-CM | POA: Diagnosis not present

## 2024-01-04 DIAGNOSIS — Z01818 Encounter for other preprocedural examination: Secondary | ICD-10-CM

## 2024-01-04 DIAGNOSIS — D649 Anemia, unspecified: Secondary | ICD-10-CM | POA: Diagnosis not present

## 2024-01-04 DIAGNOSIS — N8003 Adenomyosis of the uterus: Secondary | ICD-10-CM

## 2024-01-04 HISTORY — PX: ROBOTIC ASSISTED LAPAROSCOPIC HYSTERECTOMY AND SALPINGECTOMY: SHX6379

## 2024-01-04 HISTORY — DX: Unspecified hemorrhoids: K64.9

## 2024-01-04 HISTORY — DX: Excessive and frequent menstruation with irregular cycle: N92.1

## 2024-01-04 HISTORY — DX: Dysmenorrhea, unspecified: N94.6

## 2024-01-04 HISTORY — DX: Adenomyosis of the uterus: N80.03

## 2024-01-04 HISTORY — PX: CYSTOSCOPY: SHX5120

## 2024-01-04 LAB — TYPE AND SCREEN
ABO/RH(D): A POS
ABO/RH(D): A POS
Antibody Screen: NEGATIVE
Antibody Screen: NEGATIVE

## 2024-01-04 LAB — POCT PREGNANCY, URINE: Preg Test, Ur: NEGATIVE

## 2024-01-04 SURGERY — XI ROBOTIC ASSISTED LAPAROSCOPIC HYSTERECTOMY AND SALPINGECTOMY
Anesthesia: General | Site: Urethra

## 2024-01-04 MED ORDER — MIDAZOLAM HCL 2 MG/2ML IJ SOLN
INTRAMUSCULAR | Status: AC
  Filled 2024-01-04: qty 2

## 2024-01-04 MED ORDER — PROPOFOL 10 MG/ML IV BOLUS
INTRAVENOUS | Status: AC
  Filled 2024-01-04: qty 20

## 2024-01-04 MED ORDER — OXYCODONE HCL 5 MG PO TABS
5.0000 mg | ORAL_TABLET | Freq: Once | ORAL | Status: AC | PRN
  Administered 2024-01-04: 5 mg via ORAL

## 2024-01-04 MED ORDER — LIDOCAINE HCL (PF) 2 % IJ SOLN
INTRAMUSCULAR | Status: AC
  Filled 2024-01-04: qty 5

## 2024-01-04 MED ORDER — STERILE WATER FOR IRRIGATION IR SOLN
Status: DC | PRN
  Administered 2024-01-04: 1000 mL

## 2024-01-04 MED ORDER — DEXAMETHASONE SODIUM PHOSPHATE 4 MG/ML IJ SOLN
INTRAMUSCULAR | Status: DC | PRN
  Administered 2024-01-04: 5 mg via INTRAVENOUS

## 2024-01-04 MED ORDER — SUGAMMADEX SODIUM 200 MG/2ML IV SOLN
INTRAVENOUS | Status: DC | PRN
  Administered 2024-01-04: 200 mg via INTRAVENOUS

## 2024-01-04 MED ORDER — DEXAMETHASONE SODIUM PHOSPHATE 10 MG/ML IJ SOLN
INTRAMUSCULAR | Status: AC
  Filled 2024-01-04: qty 1

## 2024-01-04 MED ORDER — AMISULPRIDE (ANTIEMETIC) 5 MG/2ML IV SOLN
INTRAVENOUS | Status: AC
  Filled 2024-01-04: qty 4

## 2024-01-04 MED ORDER — PHENYLEPHRINE 80 MCG/ML (10ML) SYRINGE FOR IV PUSH (FOR BLOOD PRESSURE SUPPORT)
PREFILLED_SYRINGE | INTRAVENOUS | Status: AC
  Filled 2024-01-04: qty 10

## 2024-01-04 MED ORDER — OXYCODONE HCL 5 MG PO TABS
ORAL_TABLET | ORAL | Status: AC
  Filled 2024-01-04: qty 1

## 2024-01-04 MED ORDER — KETOROLAC TROMETHAMINE 30 MG/ML IJ SOLN: 30.0000 mg | Freq: Once | INTRAMUSCULAR | Status: DC | PRN

## 2024-01-04 MED ORDER — LACTATED RINGERS IV SOLN: INTRAVENOUS | Status: AC

## 2024-01-04 MED ORDER — HYDROMORPHONE HCL 1 MG/ML IJ SOLN
0.2500 mg | INTRAMUSCULAR | Status: DC | PRN
  Administered 2024-01-04 (×2): 0.25 mg via INTRAVENOUS

## 2024-01-04 MED ORDER — SOD CITRATE-CITRIC ACID 500-334 MG/5ML PO SOLN: 30.0000 mL | ORAL | Status: DC

## 2024-01-04 MED ORDER — OXYCODONE HCL 5 MG/5ML PO SOLN: 5.0000 mg | Freq: Once | ORAL | Status: AC | PRN

## 2024-01-04 MED ORDER — GABAPENTIN 300 MG PO CAPS
ORAL_CAPSULE | ORAL | Status: AC
  Filled 2024-01-04: qty 1

## 2024-01-04 MED ORDER — PHENYLEPHRINE HCL (PRESSORS) 10 MG/ML IV SOLN
INTRAVENOUS | Status: DC | PRN
  Administered 2024-01-04: 80 ug via INTRAVENOUS

## 2024-01-04 MED ORDER — ONDANSETRON HCL 4 MG/2ML IJ SOLN: 4.0000 mg | Freq: Once | INTRAMUSCULAR | Status: DC | PRN

## 2024-01-04 MED ORDER — KETOROLAC TROMETHAMINE 30 MG/ML IJ SOLN
INTRAMUSCULAR | Status: DC | PRN
  Administered 2024-01-04: 30 mg via INTRAVENOUS

## 2024-01-04 MED ORDER — LIDOCAINE HCL (CARDIAC) PF 100 MG/5ML IV SOSY
PREFILLED_SYRINGE | INTRAVENOUS | Status: DC | PRN
  Administered 2024-01-04: 100 mg via INTRAVENOUS

## 2024-01-04 MED ORDER — AMISULPRIDE (ANTIEMETIC) 5 MG/2ML IV SOLN
10.0000 mg | Freq: Once | INTRAVENOUS | Status: AC | PRN
  Administered 2024-01-04: 10 mg via INTRAVENOUS

## 2024-01-04 MED ORDER — SCOPOLAMINE 1 MG/3DAYS TD PT72
MEDICATED_PATCH | TRANSDERMAL | Status: AC
  Filled 2024-01-04: qty 1

## 2024-01-04 MED ORDER — SCOPOLAMINE 1 MG/3DAYS TD PT72
1.0000 | MEDICATED_PATCH | Freq: Once | TRANSDERMAL | Status: DC
  Administered 2024-01-04: 1.5 mg via TRANSDERMAL

## 2024-01-04 MED ORDER — ROCURONIUM BROMIDE 10 MG/ML (PF) SYRINGE
PREFILLED_SYRINGE | INTRAVENOUS | Status: AC
  Filled 2024-01-04: qty 10

## 2024-01-04 MED ORDER — ROCURONIUM BROMIDE 100 MG/10ML IV SOLN
INTRAVENOUS | Status: DC | PRN
  Administered 2024-01-04: 60 mg via INTRAVENOUS
  Administered 2024-01-04: 20 mg via INTRAVENOUS

## 2024-01-04 MED ORDER — ACETAMINOPHEN 500 MG PO TABS
ORAL_TABLET | ORAL | Status: AC
  Filled 2024-01-04: qty 2

## 2024-01-04 MED ORDER — ACETAMINOPHEN 500 MG PO TABS
1000.0000 mg | ORAL_TABLET | ORAL | Status: AC
  Administered 2024-01-04: 1000 mg via ORAL

## 2024-01-04 MED ORDER — MIDAZOLAM HCL 5 MG/5ML IJ SOLN
INTRAMUSCULAR | Status: DC | PRN
  Administered 2024-01-04: 2 mg via INTRAVENOUS

## 2024-01-04 MED ORDER — FENTANYL CITRATE (PF) 250 MCG/5ML IJ SOLN
INTRAMUSCULAR | Status: AC
  Filled 2024-01-04: qty 5

## 2024-01-04 MED ORDER — LACTATED RINGERS IV SOLN
INTRAVENOUS | Status: DC
Start: 2024-01-04 — End: 2024-01-04

## 2024-01-04 MED ORDER — MEPERIDINE HCL 25 MG/ML IJ SOLN: 6.2500 mg | INTRAMUSCULAR | Status: DC | PRN

## 2024-01-04 MED ORDER — PROPOFOL 10 MG/ML IV BOLUS
INTRAVENOUS | Status: DC | PRN
  Administered 2024-01-04: 50 mg via INTRAVENOUS
  Administered 2024-01-04: 150 mg via INTRAVENOUS

## 2024-01-04 MED ORDER — FENTANYL CITRATE (PF) 100 MCG/2ML IJ SOLN
INTRAMUSCULAR | Status: DC | PRN
  Administered 2024-01-04: 50 ug via INTRAVENOUS
  Administered 2024-01-04: 100 ug via INTRAVENOUS
  Administered 2024-01-04 (×2): 50 ug via INTRAVENOUS

## 2024-01-04 MED ORDER — GABAPENTIN 300 MG PO CAPS
300.0000 mg | ORAL_CAPSULE | ORAL | Status: AC
  Administered 2024-01-04: 300 mg via ORAL

## 2024-01-04 MED ORDER — BUPIVACAINE HCL (PF) 0.5 % IJ SOLN
INTRAMUSCULAR | Status: DC | PRN
  Administered 2024-01-04: 18 mL

## 2024-01-04 MED ORDER — CEFOXITIN SODIUM 2 G IV SOLR
INTRAVENOUS | Status: AC
  Filled 2024-01-04: qty 2

## 2024-01-04 MED ORDER — HYDROMORPHONE HCL 1 MG/ML IJ SOLN
INTRAMUSCULAR | Status: AC
  Filled 2024-01-04: qty 1

## 2024-01-04 MED ORDER — SODIUM CHLORIDE 0.9 % IV SOLN
2.0000 g | INTRAVENOUS | Status: AC
  Administered 2024-01-04: 2 g via INTRAVENOUS

## 2024-01-04 MED ORDER — POVIDONE-IODINE 10 % EX SWAB
2.0000 | Freq: Once | CUTANEOUS | Status: AC
  Administered 2024-01-04: 2 via TOPICAL

## 2024-01-04 MED ORDER — SODIUM CHLORIDE 0.9 % IV SOLN
INTRAVENOUS | Status: AC
  Filled 2024-01-04: qty 100

## 2024-01-04 MED ORDER — ONDANSETRON HCL 4 MG/2ML IJ SOLN
INTRAMUSCULAR | Status: AC
  Filled 2024-01-04: qty 2

## 2024-01-04 MED ORDER — ONDANSETRON HCL 4 MG/2ML IJ SOLN
INTRAMUSCULAR | Status: DC | PRN
  Administered 2024-01-04: 4 mg via INTRAVENOUS

## 2024-01-04 MED ORDER — CEFAZOLIN SODIUM 1 G IJ SOLR
Freq: Once | INTRAMUSCULAR | Status: AC
  Administered 2024-01-04: 800 mL
  Filled 2024-01-04: qty 10

## 2024-01-04 SURGICAL SUPPLY — 50 items
CATH FOLEY 3WAY 5CC 16FR (CATHETERS) ×2 IMPLANT
COVER BACK TABLE 60X90IN (DRAPES) ×2 IMPLANT
COVER TIP SHEARS 8 DVNC (MISCELLANEOUS) ×2 IMPLANT
DEFOGGER SCOPE WARMER CLEARIFY (MISCELLANEOUS) ×2 IMPLANT
DERMABOND ADVANCED .7 DNX12 (GAUZE/BANDAGES/DRESSINGS) ×2 IMPLANT
DRAPE ARM DVNC X/XI (DISPOSABLE) ×8 IMPLANT
DRAPE COLUMN DVNC XI (DISPOSABLE) ×2 IMPLANT
DRAPE UTILITY XL STRL (DRAPES) ×2 IMPLANT
DRIVER NDL MEGA SUTCUT DVNCXI (INSTRUMENTS) IMPLANT
DRIVER NDLE MEGA SUTCUT DVNCXI (INSTRUMENTS) ×2
DRSG TEGADERM 4X4.75 (GAUZE/BANDAGES/DRESSINGS) IMPLANT
DURAPREP 26ML APPLICATOR (WOUND CARE) ×2 IMPLANT
ELECT REM PT RETURN 9FT ADLT (ELECTROSURGICAL) ×2
ELECTRODE REM PT RTRN 9FT ADLT (ELECTROSURGICAL) ×2 IMPLANT
FORCEPS PROGRASP DVNC XI (FORCEP) IMPLANT
GAUZE 4X4 16PLY ~~LOC~~+RFID DBL (SPONGE) IMPLANT
GLOVE BIOGEL PI IND STRL 7.0 (GLOVE) IMPLANT
GLOVE NEODERM STER SZ 7 (GLOVE) ×6 IMPLANT
GLOVE SURG SS PI 7.5 STRL IVOR (GLOVE) IMPLANT
GLOVE SURG SYN 6.5 ES PF (GLOVE) ×4
GLOVE SURG SYN 6.5 PF PI (GLOVE) IMPLANT
GOWN STRL REUS W/ TWL LRG LVL3 (GOWN DISPOSABLE) IMPLANT
GYRUS RUMI II 3.5CM BLUE (DISPOSABLE) ×2
HIBICLENS CHG 4% 4OZ BTL (MISCELLANEOUS) ×4 IMPLANT
IRRIG SUCT STRYKERFLOW 2 WTIP (MISCELLANEOUS) ×2
IRRIGATION SUCT STRKRFLW 2 WTP (MISCELLANEOUS) ×2 IMPLANT
KIT PINK PAD W/HEAD ARE REST (MISCELLANEOUS) ×2
KIT PINK PAD W/HEAD ARM REST (MISCELLANEOUS) ×2 IMPLANT
KIT TURNOVER CYSTO (KITS) ×2 IMPLANT
LEGGING LITHOTOMY PAIR STRL (DRAPES) ×2 IMPLANT
MANIFOLD NEPTUNE II (INSTRUMENTS) ×2 IMPLANT
OBTURATOR OPTICAL STND 8 DVNC (TROCAR) ×2
OBTURATOR OPTICALSTD 8 DVNC (TROCAR) ×2 IMPLANT
PACK ROBOT WH (CUSTOM PROCEDURE TRAY) ×2 IMPLANT
PACK ROBOTIC GOWN (GOWN DISPOSABLE) ×2 IMPLANT
PAD OB MATERNITY 4.3X12.25 (PERSONAL CARE ITEMS) ×2 IMPLANT
RUMI II GYRUS 3.5CM BLUE (DISPOSABLE) IMPLANT
SCISSORS MNPLR CVD DVNC XI (INSTRUMENTS) IMPLANT
SEAL UNIV 5-12 XI (MISCELLANEOUS) ×6 IMPLANT
SEALER VESSEL EXT DVNC XI (MISCELLANEOUS) IMPLANT
SET IRRIG Y TYPE TUR BLADDER L (SET/KITS/TRAYS/PACK) ×2 IMPLANT
SET TUBE SMOKE EVAC HIGH FLOW (TUBING) ×2 IMPLANT
SOL PREP POV-IOD 4OZ 10% (MISCELLANEOUS) ×2 IMPLANT
SPIKE FLUID TRANSFER (MISCELLANEOUS) ×2 IMPLANT
SUT MNCRL AB 4-0 PS2 18 (SUTURE) ×2 IMPLANT
SUT VLOC 180 0 9IN GS21 (SUTURE) ×2 IMPLANT
TIP UTERINE 6.7X10CM GRN DISP (MISCELLANEOUS) IMPLANT
TOWEL OR 17X24 6PK STRL BLUE (TOWEL DISPOSABLE) ×2 IMPLANT
UNDERPAD 30X36 HEAVY ABSORB (UNDERPADS AND DIAPERS) ×2 IMPLANT
WATER STERILE IRR 1000ML POUR (IV SOLUTION) IMPLANT

## 2024-01-04 NOTE — Discharge Instructions (Signed)
  Post Anesthesia Home Care Instructions  Activity: Get plenty of rest for the remainder of the day. A responsible individual must stay with you for 24 hours following the procedure.  For the next 24 hours, DO NOT: -Drive a car -Advertising copywriter -Drink alcoholic beverages -Take any medication unless instructed by your physician -Make any legal decisions or sign important papers.  Meals: Start with liquid foods such as gelatin or soup. Progress to regular foods as tolerated. Avoid greasy, spicy, heavy foods. If nausea and/or vomiting occur, drink only clear liquids until the nausea and/or vomiting subsides. Call your physician if vomiting continues.  Special Instructions/Symptoms: Your throat may feel dry or sore from the anesthesia or the breathing tube placed in your throat during surgery. If this causes discomfort, gargle with warm salt water. The discomfort should disappear within 24 hours.  If you had a scopolamine patch placed behind your ear for the management of post- operative nausea and/or vomiting:  1. The medication in the patch is effective for 72 hours, after which it should be removed.  Wrap patch in a tissue and discard in the trash. Wash hands thoroughly with soap and water. 2. You may remove the patch earlier than 72 hours if you experience unpleasant side effects which may include dry mouth, dizziness or visual disturbances. 3. Avoid touching the patch. Wash your hands with soap and water after contact with the patch.       No ibuprofen, Advil, Aleve, Motrin, ketorolac, meloxicam, naproxen, or other NSAIDS until after 5:00 pm today if needed.    No acetaminophen/Tylenol until after 12:30 pm today if needed.

## 2024-01-04 NOTE — Op Note (Signed)
01/04/2024  [id number] Jodi Short        OPERATIVE REPORT   Preop Diagnosis: menorrhagia, dysmenorrhea, anemia, fibroids  Procedure: robotic hysterectomy, bilateral salpingectomy, cystoscopy   Surgeon: Dr. Orvil Feil Kaylyne Axton Assistant: Donne Hazel, RN   Fluids: please see anesthesia report   Complications: None Anesthesia: General     Findings:  boggy contour 10cm fibroid uterus, normal ovaries and tubes Cystoscopy at the end of the case with normal bladder and patent ureters bilaterally.   Estimated blood loss: Minimal <15cc   Specimens: Uterus with fibroids, cervix and bilateral tubes   Disposition of specimen: Pathology           Patient is taken to the operating room. She is placed in the supine position. She is a running IV in place. Informed consent was present on the chart. SCDs on her lower extremities and functioning properly. Patient was positioned while she was awake.  Her legs were placed in the low lithotomy position in [place] stirrups. Her arms were tucked by the side.  General endotracheal anesthesia was administered by the anesthesia staff without difficulty.       [name] was then used to prep the abdomen and Hibiclens was used to prep the inner thighs, perineum and vagina. Once 3 minutes had past the patient was draped in a normal standard fashion. A proper time out was performed and everyone agreed.  The legs were lifted to the high lithotomy position. A bivalve speculum was inserted into the vagina and the anterior lip of the cervix was grasped with single-tooth tenaculum.  The uterus sounded to 10 cm. Pratt dilators were used to dilate the cervix.  The RUMI uterine manipulator was obtained inserted into the endometrial cavity and the bulb of the disposable tip was inflated with 8 cc of normal saline. There was a good fit of the KOH ring around the cervix. The tenaculum and bivavle speculum was removed. There is also good manipulation of the  uterus.  A Foley catheter was placed to straight drain.  Clear urine was noted. Legs were lowered to the low lithotomy position and attention was turned the abdomen.   Superior to the umbilicus, marcaine 0.25% used to anesthetize the skin.  Using #11 blade, 8mm skin incision was made.  The 8mm robotic trocar and sleeve was inserted under direct visualization.  CO2 gas was  started and patient was placed in trendelenburg position.  Two additional 8mm ports were placed under direct visualization in the left and right lower quadrant.     Ureters were identifies.  Attention was turned to the left side. The left tube was elevated and the mesosalpinx was desiccated with the vessel sealer.  The left uterine ovarian pedicle was serially clamped cauterized and incised. Left round ligament was serially clamped cauterized and incised. The anterior and posterior peritoneum of the inferior leaf of the broad ligament were opened. The beginning of the bladder flap was created.  The bladder was taken down below the level of the KOH ring. The left uterine artery skeletonized and then just superior to the KOH ring this vessel was serially clamped, cauterized, and incised.   Attention was turned the right side.  The uterus was placed on stretch to the opposite side.    The mesosalpinx was incised freeing the tube. Then the right uterine ovarian pedicle was serially clamped cauterized and incised. Next the right round ligament was serially clamped cauterized and incised. The anterior posterior peritoneum of the inferiorly for  the broad ligament were opened. The anterior peritoneum was carried across to the dissection on the left side. The remainder of the bladder flap was created using sharp dissection. The bladder was well below the level of the KOH ring. The right uterine artery skeletonized. Then the right uterine artery, above the level of the KOH ring, was serially clamped cauterized and incised. The uterus was  devascularized at this point.   The colpotomy was performed.  This was carried around a circumferential fashion until the vaginal mucosa was completely incised in the specimen was freed.  The specimen was then delivered to the vagina intact.  A vaginal occlusive device was used to maintain the pneumoperitoneum   Instruments were changed with a needle driver and prograsp.  Using a 9 inch  zero V-lock suture, the cuff was closed by incorporating the anterior and posterior vaginal mucosa in each stitch. This was carried across all the way to the left corner and a running fashion. Two stitches were brought back towards the midline and the suture was cut flush with the vagina. The needle was brought out the pelvis. The pelvis was irrigated. All pedicles were inspected. No bleeding was noted.   Co2 pressures were lowered to 8mm Hg.  Again, no bleeding was noted.  Ureters were noted deep in the pelvis to be peristalsing.  At this point the procedure was completed.  The remaining instruments were removed.  The ports were removed under direct visualization of the laparoscope and the pneumoperitoneum was relieved.   The skin was then closed with subcuticular stitches of 3-0 Vicryl. The skin was cleansed Dermabond was applied. Attention was then turned the vagina and the cuff was inspected. No bleeding was noted.  The Foley catheter was removed.  Cystoscopy was performed.  No sutures or bladder injuries were noted.  Ureters were noted with normal urine jets from each one was seen.  Foley was left out after the cystoscopic fluid was drained and cystoscope removed.  Sponge, lap, needle, instrument counts were correct x2. Patient tolerated the procedure very well. She was awakened from anesthesia, extubated and taken to recovery in stable condition.      Dr. Karma Greaser

## 2024-01-04 NOTE — Transfer of Care (Signed)
Immediate Anesthesia Transfer of Care Note  Patient: Jodi Short  Procedure(s) Performed: Procedure(s) (LRB): XI ROBOTIC ASSISTED LAPAROSCOPIC HYSTERECTOMY AND SALPINGECTOMY (Bilateral) CYSTOSCOPY (N/A)  Patient Location: PACU  Anesthesia Type: GA  Level of Consciousness: awake, sedated, patient cooperative and responds to stimulation, c/o pain in back - comfort measures given w/ medication   Airway & Oxygen Therapy: Patient Spontanous Breathing and Patient connected to [place] oxygen  Post-op Assessment: Report given to PACU RN, Post -op Vital signs reviewed and stable and Patient moving all extremities  Post vital signs: Reviewed and stable  Complications: No apparent anesthesia complications

## 2024-01-04 NOTE — Anesthesia Procedure Notes (Signed)
Procedure Name: Intubation Date/Time: 01/04/2024 7:54 AM  Performed by: Kaylyn Layer, MDPre-anesthesia Checklist: Patient identified, Emergency Drugs available, Suction available and Patient being monitored Patient Re-evaluated:Patient Re-evaluated prior to induction Oxygen Delivery Method: Circle System Utilized Preoxygenation: Pre-oxygenation with 100% oxygen Induction Type: IV induction Ventilation: Mask ventilation without difficulty Laryngoscope Size: Glidescope and 3 Tube type: Oral Tube size: 6.5 mm Number of attempts: 3 Airway Equipment and Method: Video-laryngoscopy Placement Confirmation: ETT inserted through vocal cords under direct vision, positive ETCO2 and breath sounds checked- equal and bilateral Secured at: 21 cm Tube secured with: Tape Dental Injury: Teeth and Oropharynx as per pre-operative assessment  Future Recommendations: Recommend- induction with short-acting agent, and alternative techniques readily available Comments: First attempt by CRNA with Mac 3 blade, unable to obtain view of glottis. Second attempt by myself with Hyacinth Meeker 2 blade, grade 3 view, unable to advance ETT without resistance. Final attempt with Glidescope #3 blade, partial view of cords, ETT placed easily. Recommend Glidescope for future intubations. Dereck Ligas, MD

## 2024-01-04 NOTE — Anesthesia Postprocedure Evaluation (Signed)
Anesthesia Post Note  Patient: Jodi Short  Procedure(s) Performed: XI ROBOTIC ASSISTED LAPAROSCOPIC HYSTERECTOMY AND SALPINGECTOMY (Bilateral: Pelvis) CYSTOSCOPY (Urethra)     Patient location during evaluation: PACU Anesthesia Type: General Level of consciousness: awake and alert Pain management: pain level controlled Vital Signs Assessment: post-procedure vital signs reviewed and stable Respiratory status: spontaneous breathing, nonlabored ventilation and respiratory function stable Cardiovascular status: blood pressure returned to baseline Postop Assessment: no apparent nausea or vomiting Anesthetic complications: no   No notable events documented.  Last Vitals:  Vitals:   01/04/24 1015 01/04/24 1030  BP: 119/76 120/76  Pulse: 71 68  Resp: 20 13  Temp:    SpO2: 98% 99%    Last Pain:  Vitals:   01/04/24 1015  TempSrc:   PainSc: 3                  Shanda Howells

## 2024-01-04 NOTE — Interval H&P Note (Signed)
History and Physical Interval Note:  01/04/2024 7:33 AM  Jodi Short  has presented today for surgery, with the diagnosis of Menorrhagia with irregular cycle, Dysmenorrhea, Adenomyosis.  The various methods of treatment have been discussed with the patient and family. After consideration of risks, benefits and other options for treatment, the patient has consented to  Procedure(s): XI ROBOTIC ASSISTED LAPAROSCOPIC HYSTERECTOMY AND SALPINGECTOMY (Bilateral) CYSTOSCOPY (N/A) as a surgical intervention.  The patient's history has been reviewed, patient examined, no change in status, stable for surgery.  I have reviewed the patient's chart and labs.  Questions were answered to the patient's satisfaction.     Earley Favor

## 2024-01-05 ENCOUNTER — Encounter (HOSPITAL_BASED_OUTPATIENT_CLINIC_OR_DEPARTMENT_OTHER): Payer: Self-pay | Admitting: Obstetrics and Gynecology

## 2024-01-07 LAB — SURGICAL PATHOLOGY

## 2024-01-08 ENCOUNTER — Encounter: Payer: Self-pay | Admitting: Obstetrics and Gynecology

## 2024-01-18 ENCOUNTER — Ambulatory Visit: Payer: 59 | Admitting: Obstetrics and Gynecology

## 2024-01-18 VITALS — BP 112/64 | HR 81 | Wt 167.0 lb

## 2024-01-18 DIAGNOSIS — Z09 Encounter for follow-up examination after completed treatment for conditions other than malignant neoplasm: Secondary | ICD-10-CM

## 2024-01-18 NOTE — Progress Notes (Signed)
Patient presents for 2 week postop from [facility], bilateral salpingectomy, cystoscopy. She is doing well. No fevers, VB, dysuria or severe abdominal pain.  BP 112/64 (BP Location: Left Arm, Patient Position: Sitting, Cuff Size: Normal)   Pulse 81   Wt 167 lb (75.8 kg)   LMP 12/17/2023 (Approximate)   SpO2 99%   BMI 28.67 kg/m   Abdomen: incisions I/c/d, NT, ND  A/p PO from [facility] 2 weeks doing well Encouraged no heavy lifting, pushing, pulling greater than 10 lbs for full 8 weeks 2. Pelvic rest for the entire 10 wks 3. RTC with any concerns or with heavy bleeding, fevers or severe abdominal pain. Dr. Karma Greaser

## 2024-02-02 ENCOUNTER — Other Ambulatory Visit: Payer: Self-pay | Admitting: Podiatry

## 2024-02-11 ENCOUNTER — Other Ambulatory Visit: Payer: Self-pay | Admitting: Family Medicine

## 2024-02-11 DIAGNOSIS — Z1231 Encounter for screening mammogram for malignant neoplasm of breast: Secondary | ICD-10-CM

## 2024-02-20 IMAGING — MG MM DIGITAL SCREENING BILAT W/ TOMO AND CAD
8 series · 8 of 24 positions shown · non-contrast
Comparison: Previous exam(s).

CLINICAL DATA: Screening.

EXAM:
DIGITAL SCREENING BILATERAL MAMMOGRAM WITH TOMOSYNTHESIS AND CAD
TECHNIQUE: Bilateral screening digital craniocaudal and mediolateral oblique
mammograms were obtained. Bilateral screening digital breast
tomosynthesis was performed. The images were evaluated with
computer-aided detection.

[R MLO synth-2D]
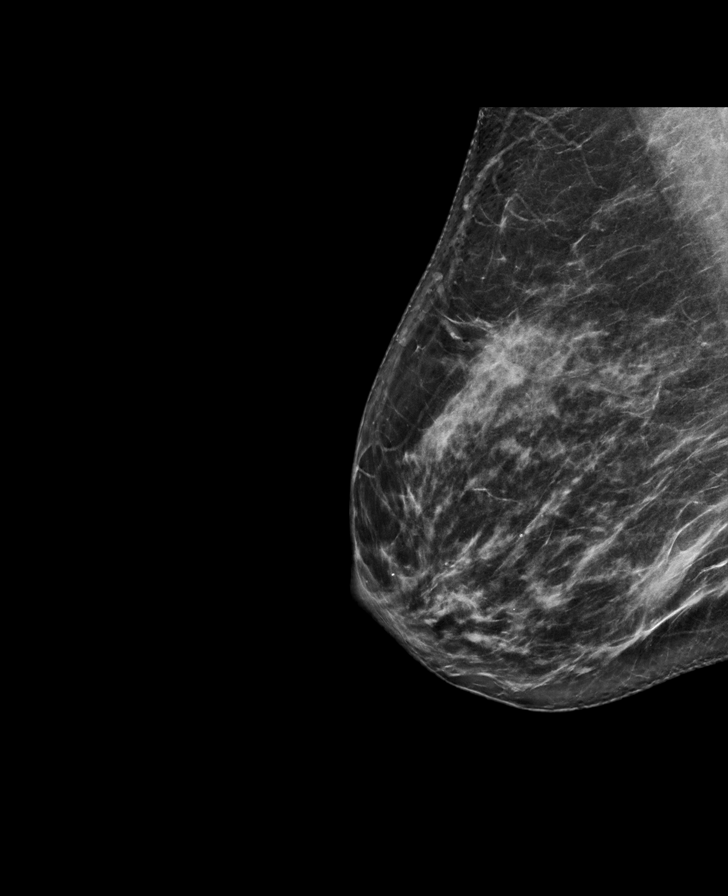

[L MLO synth-2D]
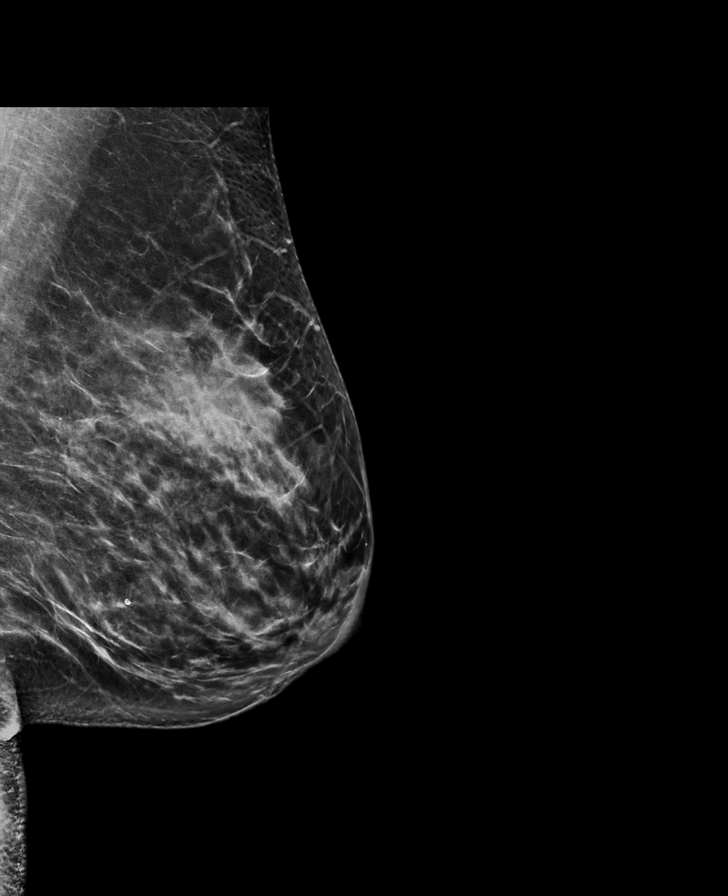

[L CC synth-2D]
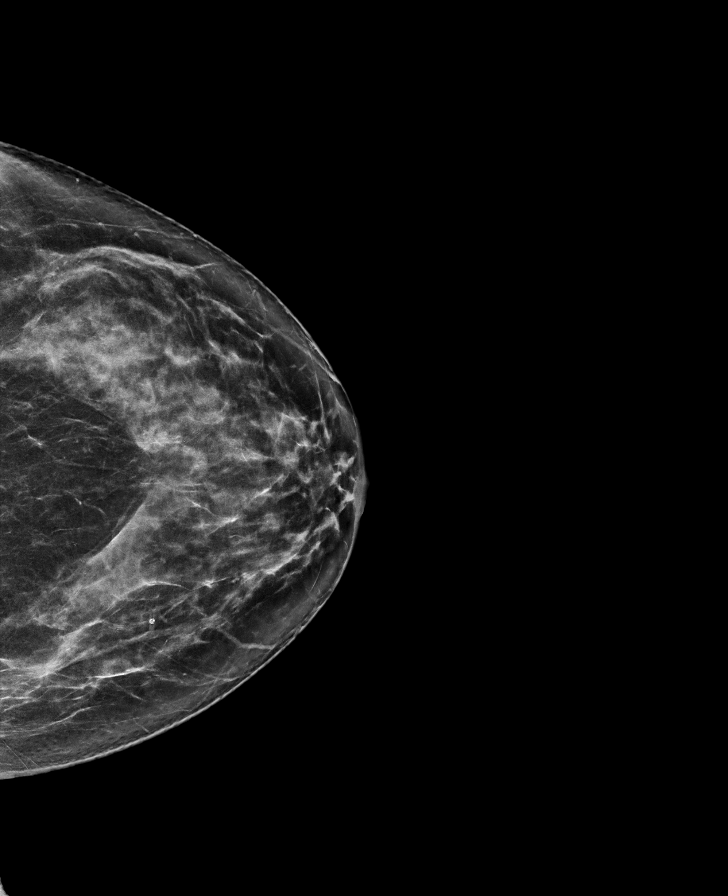

[R CC synth-2D]
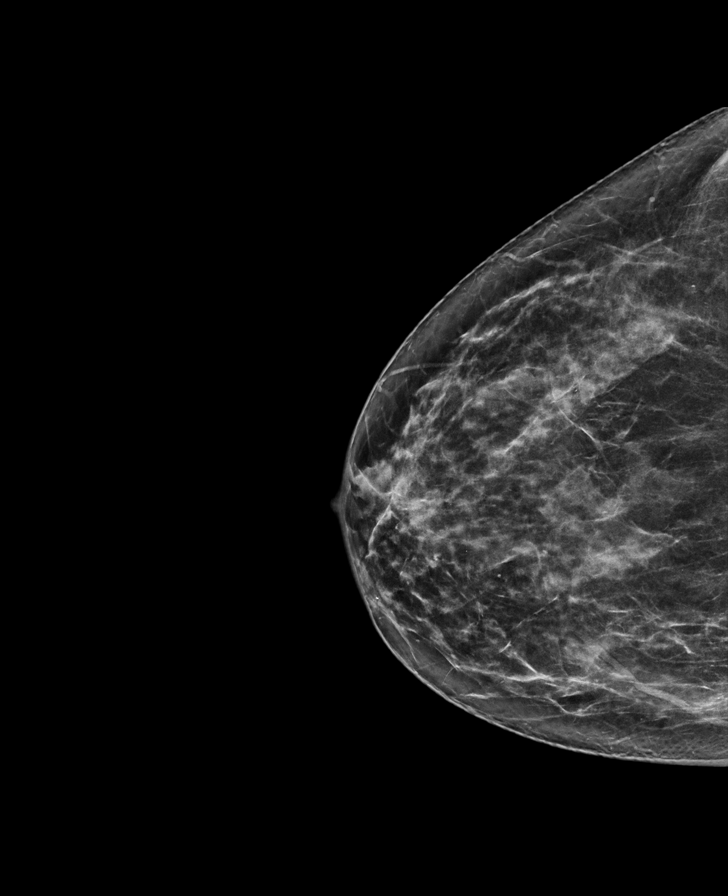

[R CC tomo · tomo slice 35/68.0]
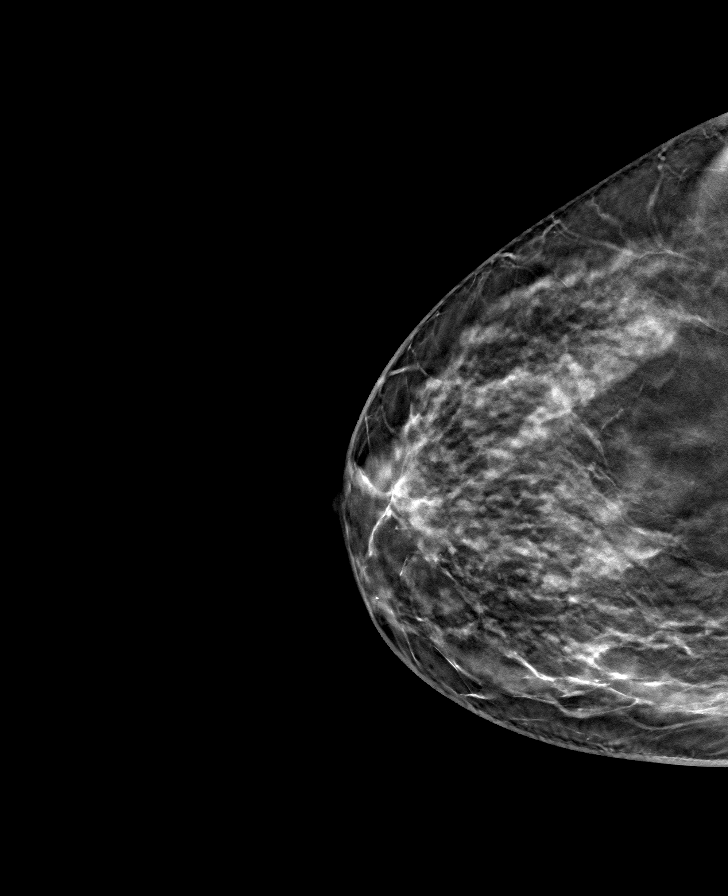

[R MLO tomo · tomo slice 37/74.0]
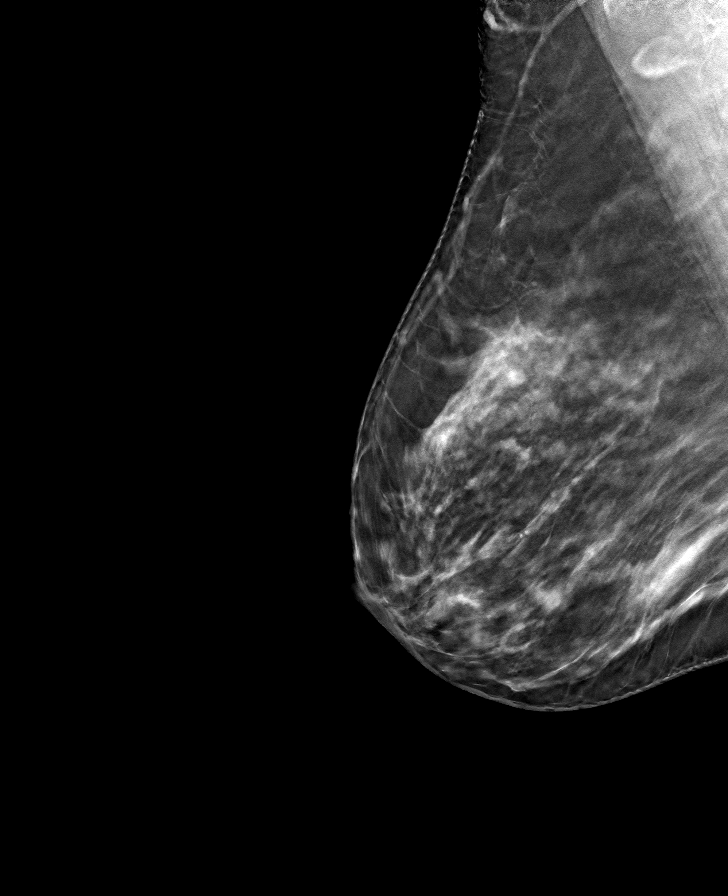

[L MLO tomo · tomo slice 39/77.0]
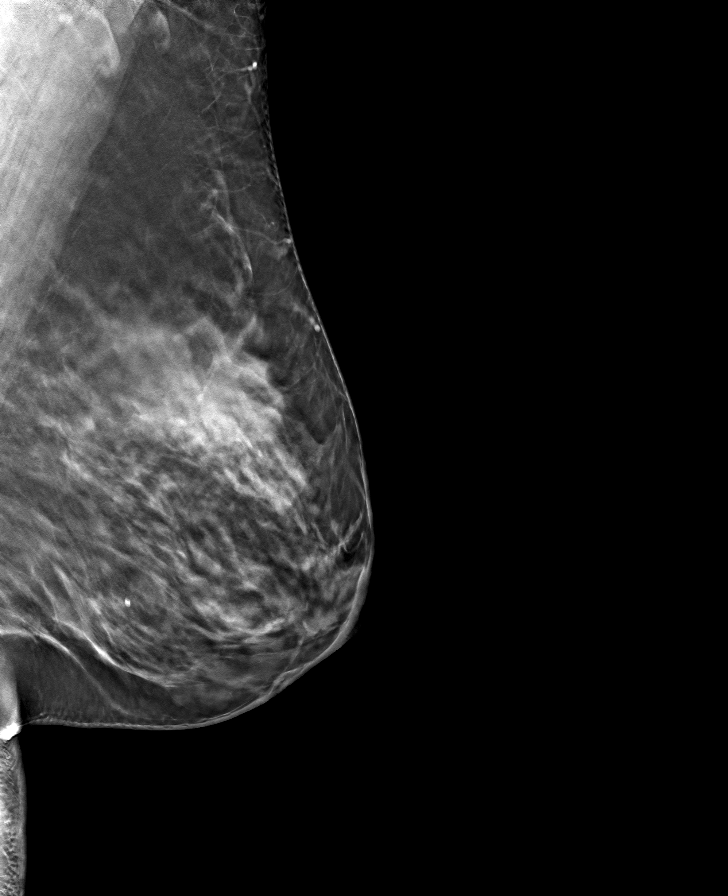

[L CC tomo · tomo slice 37/73.0]
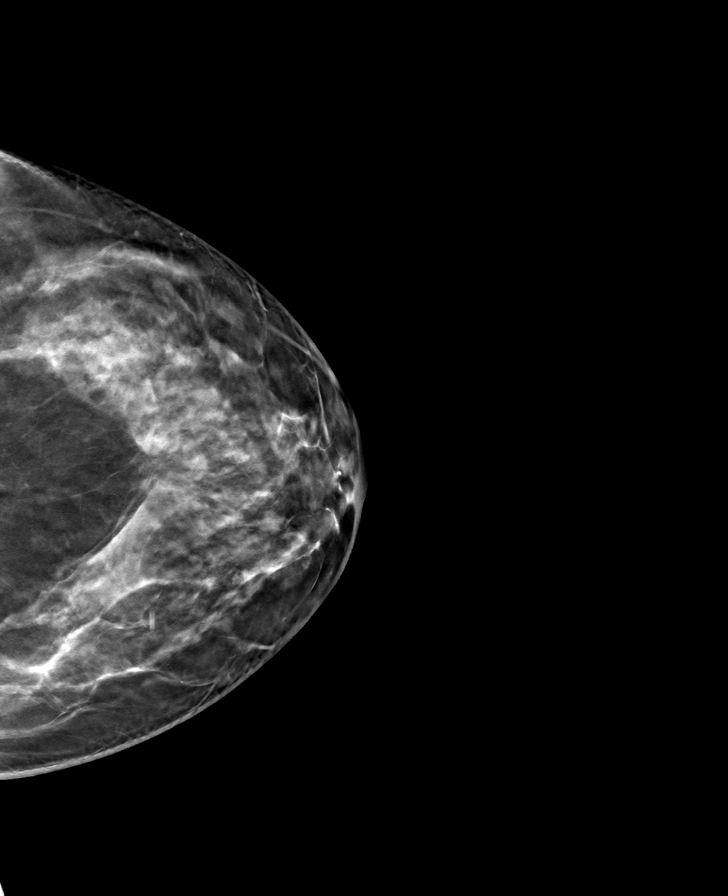

[8 of 24 positions shown; findings below may reference images not displayed]

ACR Breast Density Category c: The breast tissue is heterogeneously
dense, which may obscure small masses.
FINDINGS: In the right breast, a possible asymmetry warrants further
evaluation. In the left breast, no findings suspicious for
malignancy.
IMPRESSION: Further evaluation is suggested for possible asymmetry in the right
breast.

RECOMMENDATION:
Diagnostic mammogram and possibly ultrasound of the right breast.
(Code:[id number]-99A)

The patient will be contacted regarding the findings, and additional
imaging will be scheduled.

BI-RADS CATEGORY  0: Incomplete. Need additional imaging evaluation
and/or prior mammograms for comparison.

## 2024-02-28 ENCOUNTER — Ambulatory Visit (INDEPENDENT_AMBULATORY_CARE_PROVIDER_SITE_OTHER): Admitting: Podiatry

## 2024-02-28 ENCOUNTER — Encounter: Payer: Self-pay | Admitting: Podiatry

## 2024-02-28 VITALS — Ht 64.0 in | Wt 167.0 lb

## 2024-02-28 DIAGNOSIS — M722 Plantar fascial fibromatosis: Secondary | ICD-10-CM

## 2024-02-28 MED ORDER — TRIAMCINOLONE ACETONIDE 10 MG/ML IJ SUSP
10.0000 mg | Freq: Once | INTRAMUSCULAR | Status: AC
  Administered 2024-02-28: 10 mg via INTRA_ARTICULAR

## 2024-02-28 NOTE — Progress Notes (Signed)
 Subjective:   Patient ID: Margy Clarks, female   DOB: 53 y.o.   MRN: [id number]   HPI Patient states the heel on the left is severely bothering her and she states that it had gotten better for couple months but is come back full till at this point.  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  May require more aggressive treatment due to intensity of discomfort and will be seen back as needed

## 2024-02-29 ENCOUNTER — Encounter: Payer: Self-pay | Admitting: Obstetrics and Gynecology

## 2024-02-29 ENCOUNTER — Ambulatory Visit (INDEPENDENT_AMBULATORY_CARE_PROVIDER_SITE_OTHER): Payer: 59 | Admitting: Obstetrics and Gynecology

## 2024-02-29 VITALS — BP 110/72 | HR 74

## 2024-02-29 DIAGNOSIS — Z09 Encounter for follow-up examination after completed treatment for conditions other than malignant neoplasm: Secondary | ICD-10-CM

## 2024-02-29 NOTE — Progress Notes (Signed)
 Patient presents for 8 week postop from [facility], bilateral salpingectomy, cystoscopy. She is doing well. No fevers, VB, dysuria or severe abdominal pain.  BP 110/72   Pulse 74   LMP 12/17/2023 (Approximate)   SpO2 98%   SVE: cuff healed no suture seen but slightly tender to qtip palpation, normal discharge, no bleeding Good vaginal support  A/p PO from [facility] 10 weeks doing well Resume activities 2. Pelvic rest for additional 2 wks 3. Encouraged annual mammograms and resume annual care with PCP  Dr. Karma Greaser

## 2024-03-11 ENCOUNTER — Encounter: Payer: 59 | Admitting: Obstetrics and Gynecology

## 2024-03-12 ENCOUNTER — Ambulatory Visit: Admitting: Podiatry

## 2024-03-26 ENCOUNTER — Ambulatory Visit
Admission: RE | Admit: 2024-03-26 | Discharge: 2024-03-26 | Disposition: A | Discharge status: Home / Self Care | Source: Ambulatory Visit | Attending: Family Medicine | Admitting: Family Medicine

## 2024-03-26 DIAGNOSIS — Z1231 Encounter for screening mammogram for malignant neoplasm of breast: Secondary | ICD-10-CM

## 2024-05-03 ENCOUNTER — Other Ambulatory Visit: Payer: Self-pay | Admitting: Podiatry

## 2024-07-12 ENCOUNTER — Other Ambulatory Visit: Payer: Self-pay | Admitting: Podiatry

## 2024-08-01 ENCOUNTER — Other Ambulatory Visit (HOSPITAL_BASED_OUTPATIENT_CLINIC_OR_DEPARTMENT_OTHER): Payer: Self-pay | Admitting: Family Medicine

## 2024-08-01 DIAGNOSIS — E7800 Pure hypercholesterolemia, unspecified: Secondary | ICD-10-CM

## 2024-08-14 ENCOUNTER — Ambulatory Visit (HOSPITAL_COMMUNITY)
Admission: RE | Admit: 2024-08-14 | Discharge: 2024-08-14 | Disposition: A | Discharge status: Home / Self Care | Payer: Self-pay | Source: Ambulatory Visit | Attending: Family Medicine | Admitting: Family Medicine

## 2024-08-14 DIAGNOSIS — E7800 Pure hypercholesterolemia, unspecified: Secondary | ICD-10-CM | POA: Insufficient documentation

## 2024-08-26 ENCOUNTER — Other Ambulatory Visit: Payer: Self-pay | Admitting: Obstetrics and Gynecology

## 2024-08-26 ENCOUNTER — Encounter: Payer: Self-pay | Admitting: Obstetrics and Gynecology

## 2024-08-26 DIAGNOSIS — N393 Stress incontinence (female) (male): Secondary | ICD-10-CM

## 2024-08-26 MED ORDER — ESTRADIOL 0.1 MG/GM VA CREA: TOPICAL_CREAM | VAGINAL | 12 refills | Status: DC

## 2024-08-27 MED ORDER — ESTRADIOL 0.1 MG/GM VA CREA: TOPICAL_CREAM | VAGINAL | 12 refills | Status: AC

## 2024-08-27 NOTE — Telephone Encounter (Signed)
 Per review of EPIC, estradiol  RX was printed on 08/26/24. Rx updated and sent electronically. Patient notified.

## 2024-10-07 ENCOUNTER — Other Ambulatory Visit: Payer: Self-pay | Admitting: Podiatry

## 2024-10-23 ENCOUNTER — Other Ambulatory Visit: Payer: Self-pay | Admitting: Family Medicine

## 2024-10-23 DIAGNOSIS — K769 Liver disease, unspecified: Secondary | ICD-10-CM

## 2024-11-04 ENCOUNTER — Other Ambulatory Visit

## 2024-11-04 ENCOUNTER — Ambulatory Visit
Admission: RE | Admit: 2024-11-04 | Discharge: 2024-11-04 | Disposition: A | Source: Ambulatory Visit | Attending: Family Medicine | Admitting: Family Medicine

## 2024-11-04 DIAGNOSIS — K769 Liver disease, unspecified: Secondary | ICD-10-CM
